# Patient Record
Sex: Female | Born: 1980 | Race: White | Hispanic: No | Marital: Married | Smoking: Current every day smoker
Health system: Southern US, Community
[De-identification: ages and names within clinical notes are randomized; demographics above are authoritative.]

## PROBLEM LIST (undated history)

## (undated) DIAGNOSIS — Z8701 Personal history of pneumonia (recurrent): Secondary | ICD-10-CM

## (undated) DIAGNOSIS — K5790 Diverticulosis of intestine, part unspecified, without perforation or abscess without bleeding: Secondary | ICD-10-CM

## (undated) DIAGNOSIS — K219 Gastro-esophageal reflux disease without esophagitis: Secondary | ICD-10-CM

## (undated) DIAGNOSIS — L853 Xerosis cutis: Secondary | ICD-10-CM

## (undated) DIAGNOSIS — L720 Epidermal cyst: Secondary | ICD-10-CM

## (undated) DIAGNOSIS — G43909 Migraine, unspecified, not intractable, without status migrainosus: Secondary | ICD-10-CM

## (undated) DIAGNOSIS — Z862 Personal history of diseases of the blood and blood-forming organs and certain disorders involving the immune mechanism: Secondary | ICD-10-CM

## (undated) DIAGNOSIS — Z8742 Personal history of other diseases of the female genital tract: Secondary | ICD-10-CM

## (undated) DIAGNOSIS — M19072 Primary osteoarthritis, left ankle and foot: Secondary | ICD-10-CM

## (undated) DIAGNOSIS — Z87898 Personal history of other specified conditions: Secondary | ICD-10-CM

## (undated) HISTORY — PX: SHOULDER SURGERY: SHX246

## (undated) HISTORY — PX: TONSILLECTOMY AND ADENOIDECTOMY: SHX28

## (undated) HISTORY — DX: Personal history of other specified conditions: Z87.898

## (undated) HISTORY — DX: Gastro-esophageal reflux disease without esophagitis: K21.9

## (undated) HISTORY — PX: VAGINAL HYSTERECTOMY: SHX2639

## (undated) HISTORY — DX: Personal history of other diseases of the female genital tract: Z87.42

## (undated) HISTORY — PX: BACK SURGERY: SHX140

## (undated) HISTORY — DX: Primary osteoarthritis, left ankle and foot: M19.072

## (undated) HISTORY — DX: Personal history of pneumonia (recurrent): Z87.01

## (undated) HISTORY — DX: Personal history of diseases of the blood and blood-forming organs and certain disorders involving the immune mechanism: Z86.2

## (undated) HISTORY — PX: ABDOMINAL HYSTERECTOMY: SHX81

## (undated) HISTORY — DX: Migraine, unspecified, not intractable, without status migrainosus: G43.909

## (undated) HISTORY — PX: TUBAL LIGATION: SHX77

## (undated) HISTORY — DX: Epidermal cyst: L72.0

## (undated) HISTORY — DX: Xerosis cutis: L85.3

## (undated) HISTORY — PX: SPINE SURGERY: SHX786

## (undated) HISTORY — DX: Diverticulosis of intestine, part unspecified, without perforation or abscess without bleeding: K57.90

## (undated) HISTORY — PX: ANKLE GANGLION CYST EXCISION: SHX1148

## (undated) HISTORY — PX: NASAL SINUS SURGERY: SHX719

## (undated) HISTORY — PX: BREAST LUMPECTOMY: SHX2

---

## 2008-04-03 HISTORY — PX: OTHER SURGICAL HISTORY: SHX169

## 2012-04-03 DIAGNOSIS — Z86010 Personal history of colonic polyps: Secondary | ICD-10-CM

## 2012-04-03 DIAGNOSIS — Z860101 Personal history of adenomatous and serrated colon polyps: Secondary | ICD-10-CM

## 2012-04-03 HISTORY — DX: Personal history of adenomatous and serrated colon polyps: Z86.0101

## 2012-04-03 HISTORY — DX: Personal history of colonic polyps: Z86.010

## 2016-05-19 DIAGNOSIS — J323 Chronic sphenoidal sinusitis: Secondary | ICD-10-CM | POA: Insufficient documentation

## 2017-04-03 HISTORY — PX: FOOT OSTEOTOMY W/ PLANTAR FASCIA RELEASE: SHX1665

## 2017-11-29 HISTORY — PX: COLONOSCOPY: SHX174

## 2017-11-29 LAB — HM COLONOSCOPY

## 2018-10-03 LAB — LIPID PANEL
Cholesterol: 165 (ref 0–200)
HDL: 51 (ref 35–70)
LDL Cholesterol: 84
LDl/HDL Ratio: 1.5
Triglycerides: 151 (ref 40–160)

## 2018-10-03 LAB — HEPATIC FUNCTION PANEL
ALT: 12 (ref 7–35)
AST: 16 (ref 13–35)
Alkaline Phosphatase: 81 (ref 25–125)
Bilirubin, Total: 0.4

## 2018-10-03 LAB — CBC AND DIFFERENTIAL
HCT: 41 (ref 36–46)
Hemoglobin: 13.6 (ref 12.0–16.0)
Neutrophils Absolute: 4.3
Platelets: 184 (ref 150–399)
WBC: 7

## 2018-10-03 LAB — BASIC METABOLIC PANEL
BUN: 7 (ref 4–21)
CO2: 20 (ref 13–22)
Chloride: 105 (ref 99–108)
Creatinine: 0.9 (ref 0.5–1.1)
Glucose: 95
Potassium: 4 (ref 3.4–5.3)
Sodium: 141 (ref 137–147)

## 2018-10-03 LAB — COMPREHENSIVE METABOLIC PANEL
Albumin: 4.3 (ref 3.5–5.0)
Calcium: 8.8 (ref 8.7–10.7)
Globulin: 1.8

## 2018-10-03 LAB — CBC: RBC: 4.45 (ref 3.87–5.11)

## 2019-10-17 LAB — HEPATIC FUNCTION PANEL
ALT: 10 (ref 7–35)
AST: 13 (ref 13–35)
Alkaline Phosphatase: 73 (ref 25–125)
Bilirubin, Total: 0.6

## 2019-10-17 LAB — LIPID PANEL
Cholesterol: 236 — AB (ref 0–200)
HDL: 59 (ref 35–70)
LDL Cholesterol: 146
Triglycerides: 176 — AB (ref 40–160)

## 2019-10-17 LAB — CBC AND DIFFERENTIAL
HCT: 43 (ref 36–46)
Hemoglobin: 13.5 (ref 12.0–16.0)
Neutrophils Absolute: 3.4
Platelets: 208 (ref 150–399)
WBC: 5.9

## 2019-10-17 LAB — CBC: RBC: 4.67 (ref 3.87–5.11)

## 2019-10-17 LAB — COMPREHENSIVE METABOLIC PANEL WITH GFR
Albumin: 4.4 (ref 3.5–5.0)
Calcium: 9.2 (ref 8.7–10.7)
GFR calc Af Amer: 101
GFR calc non Af Amer: 87
Globulin: 2.3

## 2019-10-17 LAB — BASIC METABOLIC PANEL WITH GFR
BUN: 6 (ref 4–21)
CO2: 25 — AB (ref 13–22)
Chloride: 102 (ref 99–108)
Creatinine: 0.8 (ref 0.5–1.1)
Glucose: 91
Potassium: 4.1 (ref 3.4–5.3)
Sodium: 130 — AB (ref 137–147)

## 2021-02-07 ENCOUNTER — Encounter: Payer: Self-pay | Admitting: General Practice

## 2021-02-15 ENCOUNTER — Encounter: Payer: Self-pay | Admitting: Family Medicine

## 2021-02-28 ENCOUNTER — Encounter: Payer: Self-pay | Admitting: Family Medicine

## 2021-02-28 ENCOUNTER — Other Ambulatory Visit: Payer: Self-pay

## 2021-02-28 ENCOUNTER — Ambulatory Visit (INDEPENDENT_AMBULATORY_CARE_PROVIDER_SITE_OTHER): Payer: Managed Care, Other (non HMO) | Admitting: Family Medicine

## 2021-02-28 VITALS — BP 113/75 | HR 72 | Temp 98.2°F | Ht 68.0 in | Wt 147.4 lb

## 2021-02-28 DIAGNOSIS — Z23 Encounter for immunization: Secondary | ICD-10-CM | POA: Diagnosis not present

## 2021-02-28 DIAGNOSIS — Z131 Encounter for screening for diabetes mellitus: Secondary | ICD-10-CM

## 2021-02-28 DIAGNOSIS — Z0000 Encounter for general adult medical examination without abnormal findings: Secondary | ICD-10-CM

## 2021-02-28 DIAGNOSIS — Z124 Encounter for screening for malignant neoplasm of cervix: Secondary | ICD-10-CM

## 2021-02-28 DIAGNOSIS — Z13220 Encounter for screening for lipoid disorders: Secondary | ICD-10-CM

## 2021-02-28 DIAGNOSIS — Z1231 Encounter for screening mammogram for malignant neoplasm of breast: Secondary | ICD-10-CM

## 2021-02-28 DIAGNOSIS — Z1329 Encounter for screening for other suspected endocrine disorder: Secondary | ICD-10-CM

## 2021-02-28 DIAGNOSIS — Z862 Personal history of diseases of the blood and blood-forming organs and certain disorders involving the immune mechanism: Secondary | ICD-10-CM

## 2021-02-28 DIAGNOSIS — G43909 Migraine, unspecified, not intractable, without status migrainosus: Secondary | ICD-10-CM

## 2021-02-28 DIAGNOSIS — L853 Xerosis cutis: Secondary | ICD-10-CM | POA: Diagnosis not present

## 2021-02-28 NOTE — Progress Notes (Signed)
Office Note 02/28/2021  CC:  Chief Complaint  Patient presents with   Establish Care    NP. Est care/ requesting referrals for gyn, neurologist, and dermatologist.   Gastroesophageal Reflux   HPI:  Haley Calderon is a 40 y.o. female who is here to establish care, annual health maintenance exam. Patient's most recent primary MD: Patsy Lager, MD-->[facility] in [place], [place]. Old records were reviewed prior to or during today's visit.  Haley Calderon relocated to the area from [facility].  She only lived there short while, having come from [place].  She has been following her husband's job has been moving him around.  Patient currently feels well.  He feels like the over-the-counter Prevacid 15 mg a day as needed is helpful. Her headaches have been well controlled on Ajovy for prevention and either maxalt or ubrelvy for abortive treatment. She requests neurology referral today for ongoing migraine HA management.  She describes a history of endometriosis she is status post hysterectomy.    Most recent pap smear and mammogram 07/2019 per pt report.  Past Medical History:  Diagnosis Date   Diverticulosis    on colonoscopy 2019   GERD (gastroesophageal reflux disease)    History of adenomatous polyp of colon 2014   History of iron deficiency anemia    History of mastitis    History of pneumonia    Migraine syndrome     Past Surgical History:  Procedure Laterality Date   ANKLE GANGLION CYST EXCISION Bilateral    BACK SURGERY     L5   BREAST LUMPECTOMY     CESAREAN SECTION     X2   COLONOSCOPY  11/29/2017   2014->adenomatous polyp.  11/29/17->DIVERTICULOSIS; INTERNAL HEMORRHOIDS. Recall 11/2022 ([facility], Dr. Arthor Captain. Guerry Bruin.   NASAL SINUS SURGERY     R HEEL TUMOR     BENIGN   SHOULDER SURGERY Left    TONSILLECTOMY AND ADENOIDECTOMY     VAGINAL HYSTERECTOMY      History reviewed. No pertinent  family history.  Social History   Socioeconomic History   Marital status: Not on file    Spouse name: Not on file   Number of children: Not on file   Years of education: Not on file   Highest education level: Not on file  Occupational History   Not on file  Tobacco Use   Smoking status: Every Day    Packs/day: 1.00    Types: Cigarettes   Smokeless tobacco: Never  Substance and Sexual Activity   Alcohol use: Yes    Comment: socially   Drug use: Not on file   Sexual activity: Not on file  Other Topics Concern   Not on file  Social History Narrative   Not on file   Social Determinants of Health   Financial Resource Strain: Not on file  Food Insecurity: Not on file  Transportation Needs: Not on file  Physical Activity: Not on file  Stress: Not on file  Social Connections: Not on file  Intimate Partner Violence: Not on file    MEDS:  Ajovy 225mg  [place] q month. Lansoprazole 15mg  qd Rizatriptan 10mg  qd prn Tizanidine 4mg  qd prn. Roselyn Meier prn  Allergies  Allergen Reactions   Hydromorphone Nausea And Vomiting and Shortness Of Breath    Other reaction(s): Dizziness Projectile vomiting Projectile vomiting     ROS Review of Systems  Constitutional:  Negative for appetite change, chills, fatigue and fever.  HENT:  Negative for congestion, dental problem, ear pain and sore throat.   Eyes:  Negative for discharge, redness and visual disturbance.  Respiratory:  Negative for cough, chest tightness, shortness of breath and wheezing.   Cardiovascular:  Negative for chest pain, palpitations and leg swelling.  Gastrointestinal:  Negative for abdominal pain, blood in stool, diarrhea, nausea and vomiting.  Genitourinary:  Negative for difficulty urinating, dysuria, flank pain, frequency, hematuria and urgency.  Musculoskeletal:  Negative for arthralgias, back pain, joint swelling, myalgias and neck stiffness.  Skin:  Negative for pallor and rash.  Neurological:  Negative for  dizziness, speech difficulty, weakness and headaches.  Hematological:  Negative for adenopathy. Does not bruise/bleed easily.  Psychiatric/Behavioral:  Negative for confusion and sleep disturbance. The patient is not nervous/anxious.    PE; Blood pressure 113/75, pulse 72, temperature 98.2 F (36.8 C), temperature source Temporal, height 5\' 8"  (1.727 m), weight 147 lb 6.4 oz (66.9 kg), SpO2 98 %.Body mass index is 22.41 kg/m.  EXAM chaperoned by female CMA  Gen: Alert, well appearing.  Patient is oriented to person, place, time, and situation. AFFECT: pleasant, lucid thought and speech. ENT: Ears: EACs clear, normal epithelium.  TMs with good light reflex and landmarks bilaterally.  Eyes: no injection, icteris, swelling, or exudate.  EOMI, PERRLA. Nose: no drainage or turbinate edema/swelling.  No injection or focal lesion.  Mouth: lips without lesion/swelling.  Oral mucosa pink and moist.  Dentition intact and without obvious caries or gingival swelling.  Oropharynx without erythema, exudate, or swelling.  Neck: supple/nontender.  No LAD, mass, or TM.  Carotid pulses 2+ bilaterally, without bruits. CV: RRR, no m/r/g.   LUNGS: CTA bilat, nonlabored resps, good aeration in all lung fields. ABD: soft, NT, ND, BS normal.  No hepatospenomegaly or mass.  No bruits. EXT: no clubbing, cyanosis, or edema.  Musculoskeletal: no joint swelling, erythema, warmth, or tenderness.  ROM of all joints intact. Skin - no sores or suspicious lesions or rashes or color changes  Pertinent labs:   Lab Results  Component Value Date   WBC 5.9 10/17/2019   HGB 13.5 10/17/2019   HCT 43 10/17/2019   PLT 208 10/17/2019   Lab Results  Component Value Date   CREATININE 0.8 10/17/2019   BUN 6 10/17/2019   NA 130 (A) 10/17/2019   K 4.1 10/17/2019   CL 102 10/17/2019   CO2 25 (A) 10/17/2019   Lab Results  Component Value Date   ALT 10 10/17/2019   AST 13 10/17/2019   ALKPHOS 73 10/17/2019   Lab Results   Component Value Date   CHOL 236 (A) 10/17/2019   Lab Results  Component Value Date   HDL 59 10/17/2019   Lab Results  Component Value Date   LDLCALC 146 10/17/2019   Lab Results  Component Value Date   TRIG 176 (A) 10/17/2019   ASSESSMENT AND PLAN:   New patient, establishing care.  1.  Migraine headache syndrome.  Well-controlled on Ajovy injections and prn maxalt or ubrelvy.  Per patient request will refer to neurologist for further management.  2. Health maintenance exam: Reviewed age and gender appropriate health maintenance issues (prudent diet, regular exercise, health risks of tobacco and excessive alcohol, use of seatbelts, fire alarms in home, use of sunscreen).  Also reviewed age and gender appropriate health screening as well as vaccine recommendations. Vaccines: Tdap and flu vaccines today.   Labs: fasting health panel today (screening for diabetes and hyperlipidemia).  CBC d/t hx of  IDA. Cervical ca screening: likely does not need any further screening given hx of hysterectomy. Will refer to GYN. Breast ca screening: mammogram ordered today. Colon ca screening: average risk patient= as per latest guidelines, start screening at 20yrs of age.  Pt has hx of dry skin, rosacea, and melasma-> requests referral to dermatologist so I ordered this today.  An After Visit Summary was printed and given to the patient.  No follow-ups on file.  Signed:  Crissie Sickles, MD           02/28/2021

## 2021-03-01 LAB — LIPID PANEL
Cholesterol: 157 mg/dL (ref 0–200)
HDL: 45.1 mg/dL (ref >39.00)
LDL Cholesterol: 73 mg/dL (ref 0–99)
NonHDL: 112.17
Total CHOL/HDL Ratio: 3
Triglycerides: 198 mg/dL — ABNORMAL HIGH (ref 0.0–149.0)
VLDL: 39.6 mg/dL (ref 0.0–40.0)

## 2021-03-01 LAB — CBC WITH DIFFERENTIAL/PLATELET
Basophils Absolute: 0 10*3/uL (ref 0.0–0.1)
Basophils Relative: 0.5 % (ref 0.0–3.0)
Eosinophils Absolute: 0 10*3/uL (ref 0.0–0.7)
Eosinophils Relative: 0.4 % (ref 0.0–5.0)
HCT: 42.9 % (ref 36.0–46.0)
Hemoglobin: 14.6 g/dL (ref 12.0–15.0)
Lymphocytes Relative: 36.4 % (ref 12.0–46.0)
Lymphs Abs: 1.6 10*3/uL (ref 0.7–4.0)
MCHC: 34 g/dL (ref 30.0–36.0)
MCV: 94.6 fl (ref 78.0–100.0)
Monocytes Absolute: 0.3 10*3/uL (ref 0.1–1.0)
Monocytes Relative: 6.2 % (ref 3.0–12.0)
Neutro Abs: 2.4 10*3/uL (ref 1.4–7.7)
Neutrophils Relative %: 56.5 % (ref 43.0–77.0)
Platelets: 117 10*3/uL — ABNORMAL LOW (ref 150.0–400.0)
RBC: 4.54 Mil/uL (ref 3.87–5.11)
RDW: 13 % (ref 11.5–15.5)
WBC: 4.3 10*3/uL (ref 4.0–10.5)

## 2021-03-01 LAB — COMPREHENSIVE METABOLIC PANEL
ALT: 13 U/L (ref 0–35)
AST: 17 U/L (ref 0–37)
Albumin: 4.7 g/dL (ref 3.5–5.2)
Alkaline Phosphatase: 38 U/L — ABNORMAL LOW (ref 39–117)
BUN: 6 mg/dL (ref 6–23)
CO2: 25 mEq/L (ref 19–32)
Calcium: 9.1 mg/dL (ref 8.4–10.5)
Chloride: 105 mEq/L (ref 96–112)
Creatinine, Ser: 0.8 mg/dL (ref 0.40–1.20)
GFR: 91.47 mL/min (ref >60.00)
Glucose, Bld: 85 mg/dL (ref 70–99)
Potassium: 4.3 mEq/L (ref 3.5–5.1)
Sodium: 138 mEq/L (ref 135–145)
Total Bilirubin: 0.5 mg/dL (ref 0.2–1.2)
Total Protein: 6.9 g/dL (ref 6.0–8.3)

## 2021-03-01 LAB — TSH: TSH: 1.22 u[IU]/mL (ref 0.35–5.50)

## 2021-03-02 ENCOUNTER — Telehealth: Payer: Self-pay

## 2021-03-02 DIAGNOSIS — D696 Thrombocytopenia, unspecified: Secondary | ICD-10-CM

## 2021-03-02 NOTE — Telephone Encounter (Signed)
-----   Message from Tammi Sou, MD sent at 03/01/2021  8:34 PM EST ----- All labs normal except platelets slightly low. I have low suspicion that this is of any clinical significance. However, would like to have her come back for a repeat CBC in two weeks to ensure stability. Diagnosis thrombocytopenia.

## 2021-03-02 NOTE — Telephone Encounter (Signed)
Spoke with pt regarding results/recommendations,voiced understanding. Lab visit scheduled, lab orders in.

## 2021-03-09 ENCOUNTER — Telehealth: Payer: Self-pay | Admitting: Physician Assistant

## 2021-03-09 NOTE — Telephone Encounter (Signed)
Referral attached to appointment

## 2021-03-09 NOTE — Telephone Encounter (Signed)
Patient is calling for a referral appointment from Shawnie Dapper, M.D.  Patient is scheduled for 09/01/2021 at 11:00 with [facility], PA-C.

## 2021-03-16 ENCOUNTER — Ambulatory Visit (INDEPENDENT_AMBULATORY_CARE_PROVIDER_SITE_OTHER): Payer: Managed Care, Other (non HMO)

## 2021-03-16 ENCOUNTER — Other Ambulatory Visit: Payer: Self-pay

## 2021-03-16 DIAGNOSIS — D696 Thrombocytopenia, unspecified: Secondary | ICD-10-CM | POA: Diagnosis not present

## 2021-03-16 LAB — CBC WITH DIFFERENTIAL/PLATELET
Basophils Absolute: 0 10*3/uL (ref 0.0–0.1)
Basophils Relative: 0.2 % (ref 0.0–3.0)
Eosinophils Absolute: 0 10*3/uL (ref 0.0–0.7)
Eosinophils Relative: 0.7 % (ref 0.0–5.0)
HCT: 41.2 % (ref 36.0–46.0)
Hemoglobin: 14.1 g/dL (ref 12.0–15.0)
Lymphocytes Relative: 30.5 % (ref 12.0–46.0)
Lymphs Abs: 2.1 10*3/uL (ref 0.7–4.0)
MCHC: 34.2 g/dL (ref 30.0–36.0)
MCV: 94.4 fl (ref 78.0–100.0)
Monocytes Absolute: 0.3 10*3/uL (ref 0.1–1.0)
Monocytes Relative: 4.8 % (ref 3.0–12.0)
Neutro Abs: 4.4 10*3/uL (ref 1.4–7.7)
Neutrophils Relative %: 63.8 % (ref 43.0–77.0)
Platelets: 159 10*3/uL (ref 150.0–400.0)
RBC: 4.37 Mil/uL (ref 3.87–5.11)
RDW: 12.8 % (ref 11.5–15.5)
WBC: 6.8 10*3/uL (ref 4.0–10.5)

## 2021-03-24 ENCOUNTER — Ambulatory Visit: Payer: Managed Care, Other (non HMO) | Admitting: Psychiatry

## 2021-03-24 ENCOUNTER — Encounter: Payer: Self-pay | Admitting: *Deleted

## 2021-03-24 ENCOUNTER — Encounter: Payer: Self-pay | Admitting: Psychiatry

## 2021-03-24 ENCOUNTER — Telehealth: Payer: Self-pay | Admitting: *Deleted

## 2021-03-24 VITALS — BP 125/81 | HR 61 | Ht 69.75 in | Wt 148.0 lb

## 2021-03-24 DIAGNOSIS — G43119 Migraine with aura, intractable, without status migrainosus: Secondary | ICD-10-CM

## 2021-03-24 DIAGNOSIS — Z90710 Acquired absence of both cervix and uterus: Secondary | ICD-10-CM

## 2021-03-24 HISTORY — DX: Acquired absence of both cervix and uterus: Z90.710

## 2021-03-24 MED ORDER — AJOVY 225 MG/1.5ML ~~LOC~~ SOSY: PREFILLED_SYRINGE | SUBCUTANEOUS | 2 refills | Status: DC

## 2021-03-24 MED ORDER — RIZATRIPTAN BENZOATE 10 MG PO TABS
10.0000 mg | ORAL_TABLET | ORAL | 2 refills | Status: DC | PRN
Start: 2021-03-24 — End: 2022-03-07

## 2021-03-24 MED ORDER — TIZANIDINE HCL 6 MG PO CAPS: 6.0000 mg | ORAL_CAPSULE | Freq: Three times a day (TID) | ORAL | 2 refills | Status: DC | PRN

## 2021-03-24 NOTE — Patient Instructions (Addendum)
Continue Ajovy for migraine prevention Continue Maxalt and Ubrelvy as needed for migraine rescue Increase tizanidine to 6 mg up to three times a day as needed for muscle tension  Please visit [email or web address] to express your concerns with the appointment system

## 2021-03-24 NOTE — Telephone Encounter (Signed)
Tizanidine PA, key [id number], g43.119, authentification failed. Re-entered PA information on CMM, still would not go through.  [facility], was on hold > 20 minutes, ended the call. I sent her my chart explaining and advised she can try [facility]. I will try to get PA next week. Made her aware of office holiday hours.
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
## 2021-03-31 ENCOUNTER — Other Ambulatory Visit: Payer: Self-pay | Admitting: Psychiatry

## 2021-04-03 DIAGNOSIS — I7300 Raynaud's syndrome without gangrene: Secondary | ICD-10-CM

## 2021-04-03 HISTORY — DX: Raynaud's syndrome without gangrene: I73.00

## 2021-04-08 LAB — HM PAP SMEAR
HM Pap smear: NEGATIVE
Pap: NEGATIVE

## 2021-04-08 LAB — RESULTS CONSOLE HPV: CHL HPV: NEGATIVE

## 2021-04-11 ENCOUNTER — Ambulatory Visit: Payer: Managed Care, Other (non HMO)

## 2021-04-12 ENCOUNTER — Ambulatory Visit
Admission: RE | Admit: 2021-04-12 | Discharge: 2021-04-12 | Disposition: A | Discharge status: Home / Self Care | Payer: Managed Care, Other (non HMO) | Source: Ambulatory Visit | Attending: Family Medicine | Admitting: Family Medicine

## 2021-04-12 DIAGNOSIS — Z1231 Encounter for screening mammogram for malignant neoplasm of breast: Secondary | ICD-10-CM

## 2021-05-11 ENCOUNTER — Telehealth: Payer: Self-pay | Admitting: *Deleted

## 2021-05-11 NOTE — Telephone Encounter (Signed)
Tizanidine capsule PA. Key [id number] [facility] is reviewing your PA request and will respond within 24 hours for Medicaid or up to 72 hours for non-Medicaid plans.

## 2021-05-16 ENCOUNTER — Other Ambulatory Visit: Payer: Self-pay | Admitting: Psychiatry

## 2021-05-16 ENCOUNTER — Encounter: Payer: Self-pay | Admitting: *Deleted

## 2021-05-16 MED ORDER — AJOVY 225 MG/1.5ML ~~LOC~~ SOSY: PREFILLED_SYRINGE | SUBCUTANEOUS | 11 refills | Status: DC

## 2021-05-16 MED ORDER — TIZANIDINE HCL 4 MG PO TABS: 6.0000 mg | ORAL_TABLET | Freq: Four times a day (QID) | ORAL | 11 refills | Status: DC | PRN

## 2021-05-16 NOTE — Telephone Encounter (Signed)
Called [facility] to check tizanidine PA, spoke with Amber who stated this is a [employer] case, their [phone], she transferred the call, was on hold 20 minutes will call back later. Sent my chart asking patient if she is getting refills ok.

## 2021-05-16 NOTE — Telephone Encounter (Signed)
I added more Ajovy refills so she should be good for the year. I changed tizanidine to tablets. The tablets only go up to 4 mg so she'll have to take 1.5 tablets to get the 6 mg dose

## 2021-06-27 ENCOUNTER — Encounter: Payer: Self-pay | Admitting: Family Medicine

## 2021-06-27 ENCOUNTER — Other Ambulatory Visit: Payer: Self-pay

## 2021-06-27 ENCOUNTER — Telehealth: Payer: Self-pay

## 2021-06-27 ENCOUNTER — Ambulatory Visit: Payer: Managed Care, Other (non HMO) | Admitting: Family Medicine

## 2021-06-27 VITALS — BP 118/80 | HR 63 | Temp 98.0°F | Ht 69.75 in | Wt 146.6 lb

## 2021-06-27 DIAGNOSIS — Z8701 Personal history of pneumonia (recurrent): Secondary | ICD-10-CM | POA: Diagnosis not present

## 2021-06-27 DIAGNOSIS — J209 Acute bronchitis, unspecified: Secondary | ICD-10-CM

## 2021-06-27 DIAGNOSIS — J069 Acute upper respiratory infection, unspecified: Secondary | ICD-10-CM | POA: Diagnosis not present

## 2021-06-27 MED ORDER — PREDNISONE 20 MG PO TABS: ORAL_TABLET | ORAL | 0 refills | Status: DC

## 2021-06-27 MED ORDER — ALBUTEROL SULFATE HFA 108 (90 BASE) MCG/ACT IN AERS: 2.0000 | INHALATION_SPRAY | Freq: Four times a day (QID) | RESPIRATORY_TRACT | 1 refills | Status: DC | PRN

## 2021-06-27 MED ORDER — DOXYCYCLINE HYCLATE 100 MG PO CAPS: 100.0000 mg | ORAL_CAPSULE | Freq: Two times a day (BID) | ORAL | 0 refills | Status: AC

## 2021-06-27 NOTE — Telephone Encounter (Signed)
PA sent via covermymed on 06/27/21 ?  ?Key: [id number] - PA Case ID: [id number] - Rx #: [id number] ?  ?Medication: albuterol (VENTOLIN HFA) 108 (90 Base) MCG/ACT inhaler ?  ?Dx: Acute bronchitis, unspecified organism J20.9 ? ?URI with cough and congestion J06.9 ?  ?Per Dr. Anitra Lauth pt has tried and failed N/A ?  ?Waiting for response.  ? ?Preferred on formulary: ?ALBUTEROL HFA INHALER 8.5GM 90MCG ?VENTOLIN HFA INH 18GM W/COUNT 90MCG ?ALBUTEROL HFA INHALER 8.5GM 90MCG ?
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
## 2021-06-27 NOTE — Patient Instructions (Signed)
Buy over the counter mucinex dm tabs and take as directed on packaging. ?

## 2021-06-28 ENCOUNTER — Telehealth: Payer: Self-pay

## 2021-06-28 NOTE — Telephone Encounter (Signed)
PA completed yesterday for albuterol inhaler but denied.  ? ?Preferred alternative are: ? ?ALBUTEROL HFA INHALER 8.5GM 90MCG ?VENTOLIN HFA INH 18GM W/COUNT 90MCG ?ALBUTEROL HFA INHALER 8.5GM 90MCG ? ? ?Please Advise. ?

## 2021-06-29 ENCOUNTER — Ambulatory Visit: Payer: Managed Care, Other (non HMO) | Admitting: Family Medicine

## 2021-06-29 MED ORDER — ALBUTEROL SULFATE HFA 108 (90 BASE) MCG/ACT IN AERS: 2.0000 | INHALATION_SPRAY | Freq: Four times a day (QID) | RESPIRATORY_TRACT | 0 refills | Status: DC | PRN

## 2021-06-29 NOTE — Telephone Encounter (Signed)
Okay Ventolin prescription sent ?

## 2021-06-29 NOTE — Telephone Encounter (Signed)
LM for pt to return call regarding med change.  ? ?

## 2021-06-29 NOTE — Telephone Encounter (Signed)
Spoke with patient regarding results/recommendations.  

## 2021-09-01 ENCOUNTER — Encounter: Payer: Self-pay | Admitting: Physician Assistant

## 2021-09-01 ENCOUNTER — Ambulatory Visit: Payer: Managed Care, Other (non HMO) | Admitting: Physician Assistant

## 2021-09-01 DIAGNOSIS — D225 Melanocytic nevi of trunk: Secondary | ICD-10-CM

## 2021-09-01 DIAGNOSIS — L719 Rosacea, unspecified: Secondary | ICD-10-CM

## 2021-09-01 DIAGNOSIS — L309 Dermatitis, unspecified: Secondary | ICD-10-CM | POA: Diagnosis not present

## 2021-09-01 DIAGNOSIS — L700 Acne vulgaris: Secondary | ICD-10-CM

## 2021-09-01 DIAGNOSIS — D229 Melanocytic nevi, unspecified: Secondary | ICD-10-CM

## 2021-09-01 DIAGNOSIS — D485 Neoplasm of uncertain behavior of skin: Secondary | ICD-10-CM

## 2021-09-01 HISTORY — DX: Melanocytic nevi, unspecified: D22.9

## 2021-09-01 MED ORDER — RHOFADE 1 % EX CREA: TOPICAL_CREAM | CUTANEOUS | 6 refills | Status: DC

## 2021-09-01 MED ORDER — TETRIX EX CREA: TOPICAL_CREAM | CUTANEOUS | Status: DC

## 2021-09-01 MED ORDER — MINOCYCLINE HCL 100 MG PO CAPS: 100.0000 mg | ORAL_CAPSULE | Freq: Two times a day (BID) | ORAL | 6 refills | Status: DC

## 2021-09-01 MED ORDER — TRETINOIN 0.025 % EX CREA: TOPICAL_CREAM | Freq: Every day | CUTANEOUS | 6 refills | Status: AC

## 2021-09-01 MED ORDER — TETRIX EX CREA: TOPICAL_CREAM | CUTANEOUS | 9 refills | Status: DC

## 2021-09-01 NOTE — Progress Notes (Signed)
   New Patient   Subjective  Haley Calderon is a 41 y.o. female who presents for the following: New Patient (Initial Visit) (Wants refills for rosacea meds rhofade and . Also wants refill on tetrix cream.(For dry skin). /) and Annual Exam (Wants skin exam family history skin cancer. No history of skin cancer. ).   The following portions of the chart were reviewed this encounter and updated as appropriate:  Tobacco  Allergies  Meds  Problems  Med Hx  Surg Hx  Fam Hx      Objective  Well appearing patient in no apparent distress; mood and affect are within normal limits.  A full examination was performed including scalp, head, eyes, ears, nose, lips, neck, chest, axillae, abdomen, back, buttocks, bilateral upper extremities, bilateral lower extremities, hands, feet, fingers, toes, fingernails, and toenails. All findings within normal limits unless otherwise noted below.  Head - Anterior (Face) Numerous telangectasia  Head - Anterior (Face) Open comeodomes  Mid Back Bichromic dark nested macule.            Left Dorsal Hand, Right Dorsal Hand Ill-defined pink papules/plaques with scale-crust.    Assessment & Plan  Rosacea Head - Anterior (Face)  minocycline (MINOCIN) 100 MG capsule - Head - Anterior (Face) Take 1 capsule (100 mg total) by mouth 2 (two) times daily.  Oxymetazoline HCl (RHOFADE) 1 % CREA - Head - Anterior (Face) Apply to affected area qd  Acne vulgaris Head - Anterior (Face)  tretinoin (RETIN-A) 0.025 % cream - Head - Anterior (Face) Apply topically at bedtime.  Neoplasm of uncertain behavior of skin Mid Back  Skin / nail biopsy Type of biopsy: tangential   Informed consent: discussed and consent obtained   Timeout: patient name, date of birth, surgical site, and procedure verified   Procedure prep:  Patient was prepped and draped in usual sterile fashion (Non sterile) Prep type:  Chlorhexidine Anesthesia: the lesion was  anesthetized in a standard fashion   Anesthetic:  1% lidocaine w/ epinephrine 1-100,000 local infiltration Instrument used: flexible razor blade   Outcome: patient tolerated procedure well   Post-procedure details: wound care instructions given    Specimen 1 - Surgical pathology Differential Diagnosis: r/o atypia  Check Margins: yes  Eczema, unspecified type Left Dorsal Hand; Right Dorsal Hand  Dermatological Products, Misc. (TETRIX) CREA - Left Dorsal Hand, Right Dorsal Hand Apply to affected area qd     I, Marshaun Lortie, PA-C, have reviewed all documentation's for this visit.  The documentation on 09/01/21 for the exam, diagnosis, procedures and orders are all accurate and complete.

## 2021-09-26 ENCOUNTER — Encounter: Payer: Self-pay | Admitting: Physician Assistant

## 2021-09-26 ENCOUNTER — Telehealth: Payer: Self-pay | Admitting: Physician Assistant

## 2021-09-30 ENCOUNTER — Telehealth (INDEPENDENT_AMBULATORY_CARE_PROVIDER_SITE_OTHER): Payer: Managed Care, Other (non HMO) | Admitting: Dermatology

## 2021-09-30 NOTE — Telephone Encounter (Signed)
I returned patient's call.  She had contacted the office about discussing the results of her skin biopsy but had not received a call back.  I told her that the mole showed mild atypia with clean margins and no further procedure is required.  She also informed me that the pharmacy told her the Rhofade prescription would be $600 so this was not obtained.  Additionally she was told that the antibiotic was not available.  I suggested she call the office back to see if what she was told is reality or whether a substitute could be offered.

## 2021-10-10 NOTE — Telephone Encounter (Signed)
Phone call to patient with her pathology results. Patient aware.

## 2021-11-08 ENCOUNTER — Encounter: Payer: Self-pay | Admitting: Family Medicine

## 2021-12-01 ENCOUNTER — Other Ambulatory Visit: Payer: Self-pay | Admitting: Obstetrics and Gynecology

## 2021-12-01 DIAGNOSIS — N6341 Unspecified lump in right breast, subareolar: Secondary | ICD-10-CM

## 2021-12-06 ENCOUNTER — Ambulatory Visit
Admission: RE | Admit: 2021-12-06 | Discharge: 2021-12-06 | Disposition: A | Discharge status: Home / Self Care | Payer: Managed Care, Other (non HMO) | Source: Ambulatory Visit | Attending: Obstetrics and Gynecology | Admitting: Obstetrics and Gynecology

## 2021-12-06 ENCOUNTER — Other Ambulatory Visit: Payer: Self-pay | Admitting: Obstetrics and Gynecology

## 2021-12-06 DIAGNOSIS — N6341 Unspecified lump in right breast, subareolar: Secondary | ICD-10-CM

## 2021-12-06 DIAGNOSIS — N611 Abscess of the breast and nipple: Secondary | ICD-10-CM

## 2021-12-07 ENCOUNTER — Other Ambulatory Visit: Payer: Self-pay | Admitting: Obstetrics and Gynecology

## 2021-12-07 ENCOUNTER — Ambulatory Visit
Admission: RE | Admit: 2021-12-07 | Discharge: 2021-12-07 | Disposition: A | Discharge status: Home / Self Care | Payer: Managed Care, Other (non HMO) | Source: Ambulatory Visit | Attending: Obstetrics and Gynecology | Admitting: Obstetrics and Gynecology

## 2021-12-07 DIAGNOSIS — N611 Abscess of the breast and nipple: Secondary | ICD-10-CM

## 2021-12-14 ENCOUNTER — Other Ambulatory Visit: Payer: Managed Care, Other (non HMO)

## 2021-12-19 ENCOUNTER — Other Ambulatory Visit (HOSPITAL_COMMUNITY): Payer: Self-pay

## 2021-12-19 ENCOUNTER — Other Ambulatory Visit: Payer: Self-pay | Admitting: General Surgery

## 2021-12-19 MED ORDER — SULFAMETHOXAZOLE-TRIMETHOPRIM 800-160 MG PO TABS
1.0000 | ORAL_TABLET | Freq: Two times a day (BID) | ORAL | 1 refills | Status: DC
  Filled 2021-12-19: qty 14, 7d supply, fill #0

## 2021-12-20 ENCOUNTER — Other Ambulatory Visit (HOSPITAL_COMMUNITY): Payer: Self-pay

## 2021-12-21 ENCOUNTER — Other Ambulatory Visit: Payer: Managed Care, Other (non HMO)

## 2021-12-29 NOTE — Progress Notes (Signed)
Surgical Instructions    Your procedure is scheduled on Tuesday October 3rd.  Report to [facility] Main Entrance "A" at 7 A.M., then check in with the Admitting office.  Call this number if you have problems the morning of surgery:  [phone]   If you have any questions prior to your surgery date call [phone]: Open Monday-Friday 8am-4pm If you experience any cold or flu symptoms such as cough, fever, chills, shortness of breath, etc. between now and your scheduled surgery, please notify us at the above number     Remember:  Do not eat after midnight the night before your surgery  You may drink clear liquids until 6 the morning of your surgery.   Clear liquids allowed are: Water, Non-Citrus Juices (without pulp), Carbonated Beverages, Clear Tea, Black Coffee ONLY (NO MILK, CREAM OR POWDERED CREAMER of any kind), and Gatorade    Take these medicines the morning of surgery with A SIP OF WATER: sulfamethoxazole-trimethoprim (BACTRIM DS) 800-160 MG tablet   IF NEEDED  lansoprazole (PREVACID) 15 MG capsule rizatriptan (MAXALT) 10 MG tablet tiZANidine (ZANAFLEX) 4 MG tablet     As of today, STOP taking any Aspirin (unless otherwise instructed by your surgeon) Aleve, Naproxen, Ibuprofen, Motrin, Advil, Goody's, BC's, all herbal medications, fish oil, and all vitamins.           Do not wear jewelry or makeup. Do not wear lotions, powders, perfumes or deodorant. Do not shave 48 hours prior to surgery.   Do not bring valuables to the hospital. Do not wear nail polish, gel polish, artificial nails, or any other type of covering on natural nails (fingers and toes) If you have artificial nails or gel coating that need to be removed by a nail salon, please have this removed prior to surgery. Artificial nails or gel coating may interfere with anesthesia's ability to adequately monitor your vital signs.  [facility] is not responsible for any belongings or valuables.    Do NOT Smoke  (Tobacco/Vaping)  24 hours prior to your procedure  If you use a CPAP at night, you may bring your mask for your overnight stay.   Contacts, glasses, hearing aids, dentures or partials may not be worn into surgery, please bring cases for these belongings   For patients admitted to the hospital, discharge time will be determined by your treatment team.   Patients discharged the day of surgery will not be allowed to drive home, and someone needs to stay with them for 24 hours.   SURGICAL WAITING ROOM VISITATION Patients having surgery or a procedure may have no more than 2 support people in the waiting area - these visitors may rotate.   Children under the age of 52 must have an adult with them who is not the patient. If the patient needs to stay at the hospital during part of their recovery, the visitor guidelines for inpatient rooms apply. Pre-op nurse will coordinate an appropriate time for 1 support person to accompany patient in pre-op.  This support person may not rotate.   Please refer to the [facility] website for the visitor guidelines for Inpatients (after your surgery is over and you are in a regular room).    Special instructions:    Oral Hygiene is also important to reduce your risk of infection.  Remember - BRUSH YOUR TEETH THE MORNING OF SURGERY WITH YOUR REGULAR TOOTHPASTE   [facility]- Preparing For Surgery  Before surgery, you can play an important role. Because skin  is not sterile, your skin needs to be as free of germs as possible. You can reduce the number of germs on your skin by washing with CHG (chlorahexidine gluconate) Soap before surgery.  CHG is an antiseptic cleaner which kills germs and bonds with the skin to continue killing germs even after washing.     Please do not use if you have an allergy to CHG or antibacterial soaps. If your skin becomes reddened/irritated stop using the CHG.  Do not shave (including legs and underarms) for at least 48 hours prior  to first CHG shower. It is OK to shave your face.  Please follow these instructions carefully.     Shower the NIGHT BEFORE SURGERY and the MORNING OF SURGERY with CHG Soap.   If you chose to wash your hair, wash your hair first as usual with your normal shampoo. After you shampoo, rinse your hair and body thoroughly to remove the shampoo.  Then [employer] and genitals (private parts) with your normal soap and rinse thoroughly to remove soap.  After that Use CHG Soap as you would any other liquid soap. You can apply CHG directly to the skin and wash gently with a scrungie or a clean washcloth.   Apply the CHG Soap to your body ONLY FROM THE NECK DOWN.  Do not use on open wounds or open sores. Avoid contact with your eyes, ears, mouth and genitals (private parts). Wash Face and genitals (private parts)  with your normal soap.   Wash thoroughly, paying special attention to the area where your surgery will be performed.  Thoroughly rinse your body with warm water from the neck down.  DO NOT shower/wash with your normal soap after using and rinsing off the CHG Soap.  Pat yourself dry with a CLEAN TOWEL.  Wear CLEAN PAJAMAS to bed the night before surgery  Place CLEAN SHEETS on your bed the night before your surgery  DO NOT SLEEP WITH PETS.   Day of Surgery:  Take a shower with CHG soap. Wear Clean/Comfortable clothing the morning of surgery Do not apply any deodorants/lotions.   Remember to brush your teeth WITH YOUR REGULAR TOOTHPASTE.    If you received a COVID test during your pre-op visit, it is requested that you wear a mask when out in public, stay away from anyone that may not be feeling well, and notify your surgeon if you develop symptoms. If you have been in contact with anyone that has tested positive in the last 10 days, please notify your surgeon.    Please read over the following fact sheets that you were given.

## 2021-12-30 ENCOUNTER — Encounter (HOSPITAL_COMMUNITY)
Admission: RE | Admit: 2021-12-30 | Discharge: 2021-12-30 | Disposition: A | Discharge status: Home / Self Care | Payer: Managed Care, Other (non HMO) | Source: Ambulatory Visit | Attending: General Surgery | Admitting: General Surgery

## 2021-12-30 ENCOUNTER — Encounter (HOSPITAL_COMMUNITY): Payer: Self-pay

## 2021-12-30 ENCOUNTER — Other Ambulatory Visit: Payer: Self-pay

## 2021-12-30 VITALS — BP 139/78 | HR 58 | Temp 98.2°F | Resp 18 | Ht 69.0 in | Wt 153.0 lb

## 2021-12-30 DIAGNOSIS — D509 Iron deficiency anemia, unspecified: Secondary | ICD-10-CM | POA: Diagnosis not present

## 2021-12-30 DIAGNOSIS — Z01812 Encounter for preprocedural laboratory examination: Secondary | ICD-10-CM | POA: Diagnosis not present

## 2021-12-30 LAB — CBC
HCT: 43 % (ref 36.0–46.0)
Hemoglobin: 15.2 g/dL — ABNORMAL HIGH (ref 12.0–15.0)
MCH: 33.1 pg (ref 26.0–34.0)
MCHC: 35.3 g/dL (ref 30.0–36.0)
MCV: 93.7 fL (ref 80.0–100.0)
Platelets: 196 10*3/uL (ref 150–400)
RBC: 4.59 MIL/uL (ref 3.87–5.11)
RDW: 12 % (ref 11.5–15.5)
WBC: 8.9 10*3/uL (ref 4.0–10.5)
nRBC: 0 % (ref 0.0–0.2)

## 2021-12-30 NOTE — Progress Notes (Signed)
PCP - Shawnie Dapper, MD Cardiologist - Denies  PPM/ICD - Denies Device Orders -  Rep Notified -   Chest x-ray - NI EKG - NI Stress Test - Denies ECHO - Denies Cardiac Cath - Denies  Sleep Study - Denies CPAP -   DM - Denies Fasting Blood Sugar -  Checks Blood Sugar _____ times a day  Blood Thinner Instructions: Denies Aspirin Instructions:Denies  ERAS Protcol -Yes   COVID TEST- NI   Anesthesia review: NO  Patient denies shortness of breath, fever, cough and chest pain at PAT appointment   All instructions explained to the patient, with a verbal understanding of the material. Patient agrees to go over the instructions while at home for a better understanding. The opportunity to ask questions was provided.

## 2022-01-02 NOTE — H&P (Signed)
REFERRING PHYSICIAN:  Royston Sinner   PROVIDER:  Georgianne Fick, MD   Care Team: Patient Care Team: Tammi Sou, MD as PCP - General (Family Medicine) Lucillie Garfinkel (Gynecology) Suzanne Boron, MD (Dermatology)    MRN: [id number] DOB: Jul 06, 1980 DATE OF ENCOUNTER: 12/19/2021   Subjective    Chief Complaint: Right Breast Abscess       History of Present Illness: Haley Calderon is a 41 y.o. female who is seen today as an office consultation at the request of Dr. Royston Sinner for evaluation of Right Breast Abscess .     Pt presented to GYN with a right breast abscess.  She was sent to radiology for mammo/us.  The abscess appeared to be in the skin rather than the breast parenchyma so it was not aspirated.   The patient had this problem started around 2 weeks ago.  It at first was around the size of a pea.  Over the weekend it became much larger to around the size of a lime.  She underwent ultrasound and mammogram to make sure it was not too long.  It appeared to be a cyst and she went for ultrasound-guided aspiration the next day.  Was felt to be intradermal rather than subcutaneous so it was not aspirated.  She did warm compresses and around 3 days later it opened up and drained a copious amount of pus.  Few days later it opened up again and she said some of the cyst wall appeared to come out along with some other debris.  Her breast was exquisitely tender during this time.  The redness went laterally all the way up under her armpit when it was at its worst.  At that point, she was having burning pain.     Family cancer history: Her maternal grandmother had breast and colon cancer.     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  Her 41-year-old son gets off the bus at 2:45 and she has to be home for him.       Diagnostic mammogram/US 12/07/2021 [facility] ACR Breast Density Category c: The breast tissue is heterogeneously dense, which may obscure small masses.    FINDINGS: There is skin and trabecular thickening involving the anterior portion of the RIGHT breast. This region is marked as palpable with BB.   On physical exam, there is erythema and skin thickening in the 8 o'clock retroareolar region of the RIGHT breast. Patient is tender during physical exam of this region. Patient denies any drainage.   Targeted ultrasound is performed, showing a circumscribed oval collection in the 8 o'clock location of the RIGHT breast 2 centimeters from the nipple. Collection is interposed between dermis and subdermal parenchyma and measures 2.2 x 1.0 x 2.0 centimeters. There is surrounding hyperemia. No internal blood flow. Underlying parenchyma is normal.   Evaluation of the RIGHT axilla is negative.   IMPRESSION: Superficial abscess in the 8 o'clock location of the RIGHT breast.   RECOMMENDATION: 1. Recommend antibiotic treatment. Patient is given a 7 day course of doxycycline. 2. Recommend ultrasound-guided abscess drainage. Procedure has been scheduled for 12/07/2021.   I have discussed the findings and recommendations with the patient. If applicable, a reminder letter will be sent to the patient regarding the next appointment.   BI-RADS CATEGORY  2: Benign.       Review of Systems: A complete review of systems was obtained from the patient.  I have reviewed this information  and discussed as appropriate with the patient.  See HPI as well for other ROS.   Medical History: Past Medical History  History reviewed. No pertinent past medical history.        Patient Active Problem List  Diagnosis   Abscess of right breast      Past Surgical History       Past Surgical History:  Procedure Laterality Date   FUSION-NASAL/SINUS ENDOSCOPY, TISSUE REMOVAL MAXILLARY SINUS   08/29/2016    Dr. Ashley Royalty - [facility]   PERONEUS BREVIS REPAIR/TENOLYSIS FLEXOR/EXTENSOR TENDON LEG   RIGHT ANKLE   03/19/2018    Dr. Jeannine Kitten - [facility]   TOTAL  LAP HYSTERECTOMY W/BS / CYSTOSCOPY   09/16/2019    Dr. Alveta Heimlich - [facility]   PLANTAR FASCIA RELEASE   01/16/2020    Dr. Jeannine Kitten - [facility]   Back Surgery       CESAREAN SECTION       REPEAT CESAREAN SECTION            Allergies       Allergies  Allergen Reactions   Hydromorphone Dizziness, Nausea And Vomiting, Shortness Of Breath and Vomiting      Other reaction(s): Dizziness  Projectile vomiting  Projectile vomiting   Projectile vomiting        No current outpatient medications on file prior to visit.    No current facility-administered medications on file prior to visit.      Family History       Family History  Problem Relation Age of Onset   Colon cancer Mother     Breast cancer Mother     High blood pressure (Hypertension) Mother     Hyperlipidemia (Elevated cholesterol) Mother     Hyperlipidemia (Elevated cholesterol) Maternal Grandmother     High blood pressure (Hypertension) Maternal Grandmother     Breast cancer Maternal Grandmother     High blood pressure (Hypertension) Maternal Grandfather     Hyperlipidemia (Elevated cholesterol) Maternal Grandfather     Stroke Maternal Grandfather          Social History        Tobacco Use  Smoking Status Every Day   Packs/day: 1.00   Types: Cigarettes  Smokeless Tobacco Never      Social History  Social History         Socioeconomic History   Marital status: Married  Tobacco Use   Smoking status: Every Day      Packs/day: 1.00      Types: Cigarettes   Smokeless tobacco: Never  Substance and Sexual Activity   Alcohol use: Yes      Alcohol/week: 2.0 standard drinks      Types: 2 Standard drinks or equivalent per week   Drug use: Never        Objective:         Vitals:    12/19/21 1352  BP: 108/74  Pulse: 76  Temp: 36.8 C (98.3 F)  SpO2: 97%  Weight: 68.8 kg (151 lb 9.6 oz)  Height: 176.5 cm (5' 9.5")    Body mass index is 22.07 kg/m.     Gen:  No acute  distress.  Well nourished and well groomed.   Neurological: Alert and oriented to person, place, and time. Coordination normal.  Head: Normocephalic and atraumatic.  Eyes: Conjunctivae are normal. Pupils are equal, round, and reactive to light. No scleral icterus.  Neck: Normal range of motion. Neck supple.  Cardiovascular: Normal rate Breast: Appear relatively symmetric in size.  The right breast has a firm lesion around 1 x 2 cm at the right lateral areolar border.  There are 2 sinus tracts no longer draining.  There is still some faint redness in this location.  No axillary lymphadenopathy. Respiratory: Effort normal.  No respiratory distress. No chest wall tenderness. Breath sounds normal.  No wheezes, rales or rhonchi.   Skin: Skin is warm and dry. No rash noted. No diaphoresis. No erythema. No pallor. No clubbing, cyanosis, or edema.   Psychiatric: Normal mood and affect. Behavior is normal. Judgment and thought content normal.      Labs N/a   Assessment and Plan:        ICD-10-CM    1. Abscess of right breast  N61.1         This is likely an epidermal inclusion cyst.  The infection appears to be nearly completely resolved.  We will do 1 more week of Bactrim.  I will plan to excise this in its entirety.  We will do this in the operating room with sedation and numbing medicine.  I discussed the possible risk is wound complications including infection and recurrent cyst.  She understands and wishes to proceed.  She does not desire it to get large and swollen like last time again.       No follow-ups on file.     Milus Height, MD FACS Surgical Oncology, General Surgery, Trauma and [facility]

## 2022-01-03 ENCOUNTER — Other Ambulatory Visit: Payer: Self-pay

## 2022-01-03 ENCOUNTER — Ambulatory Visit (HOSPITAL_COMMUNITY)
Admission: RE | Admit: 2022-01-03 | Discharge: 2022-01-03 | Disposition: A | Discharge status: Home / Self Care | Payer: Managed Care, Other (non HMO) | Attending: General Surgery | Admitting: General Surgery

## 2022-01-03 ENCOUNTER — Ambulatory Visit (HOSPITAL_BASED_OUTPATIENT_CLINIC_OR_DEPARTMENT_OTHER): Payer: Managed Care, Other (non HMO) | Admitting: Anesthesiology

## 2022-01-03 ENCOUNTER — Encounter (HOSPITAL_COMMUNITY)
Admission: RE | Disposition: A | Discharge status: Home / Self Care | Payer: Self-pay | Source: Home / Self Care | Attending: General Surgery

## 2022-01-03 ENCOUNTER — Ambulatory Visit (HOSPITAL_COMMUNITY): Payer: Managed Care, Other (non HMO) | Admitting: Anesthesiology

## 2022-01-03 ENCOUNTER — Encounter (HOSPITAL_COMMUNITY): Payer: Self-pay | Admitting: General Surgery

## 2022-01-03 ENCOUNTER — Other Ambulatory Visit (HOSPITAL_COMMUNITY): Payer: Self-pay

## 2022-01-03 DIAGNOSIS — K219 Gastro-esophageal reflux disease without esophagitis: Secondary | ICD-10-CM | POA: Insufficient documentation

## 2022-01-03 DIAGNOSIS — N6031 Fibrosclerosis of right breast: Secondary | ICD-10-CM | POA: Diagnosis not present

## 2022-01-03 DIAGNOSIS — I7300 Raynaud's syndrome without gangrene: Secondary | ICD-10-CM | POA: Insufficient documentation

## 2022-01-03 DIAGNOSIS — L720 Epidermal cyst: Secondary | ICD-10-CM | POA: Insufficient documentation

## 2022-01-03 DIAGNOSIS — N6310 Unspecified lump in the right breast, unspecified quadrant: Secondary | ICD-10-CM

## 2022-01-03 HISTORY — PX: MASS EXCISION: SHX2000

## 2022-01-03 SURGERY — EXCISION MASS
Anesthesia: General | Site: Breast | Laterality: Right

## 2022-01-03 MED ORDER — CHLORHEXIDINE GLUCONATE CLOTH 2 % EX PADS: 6.0000 | MEDICATED_PAD | Freq: Once | CUTANEOUS | Status: DC

## 2022-01-03 MED ORDER — LIDOCAINE 2% (20 MG/ML) 5 ML SYRINGE
INTRAMUSCULAR | Status: AC
  Filled 2022-01-03: qty 5

## 2022-01-03 MED ORDER — ORAL CARE MOUTH RINSE: 15.0000 mL | Freq: Once | OROMUCOSAL | Status: AC

## 2022-01-03 MED ORDER — ONDANSETRON HCL 4 MG/2ML IJ SOLN
INTRAMUSCULAR | Status: AC
  Filled 2022-01-03: qty 2

## 2022-01-03 MED ORDER — FENTANYL CITRATE (PF) 100 MCG/2ML IJ SOLN
INTRAMUSCULAR | Status: DC | PRN
  Administered 2022-01-03 (×6): 25 ug via INTRAVENOUS

## 2022-01-03 MED ORDER — LIDOCAINE HCL (PF) 1 % IJ SOLN
INTRAMUSCULAR | Status: AC
  Filled 2022-01-03: qty 30

## 2022-01-03 MED ORDER — LIDOCAINE 2% (20 MG/ML) 5 ML SYRINGE
INTRAMUSCULAR | Status: DC | PRN
  Administered 2022-01-03: 60 mg via INTRAVENOUS

## 2022-01-03 MED ORDER — DEXAMETHASONE SODIUM PHOSPHATE 10 MG/ML IJ SOLN
INTRAMUSCULAR | Status: AC
  Filled 2022-01-03: qty 1

## 2022-01-03 MED ORDER — ACETAMINOPHEN 500 MG PO TABS
1000.0000 mg | ORAL_TABLET | ORAL | Status: AC
  Administered 2022-01-03: 1000 mg via ORAL
  Filled 2022-01-03: qty 2

## 2022-01-03 MED ORDER — DEXMEDETOMIDINE HCL IN NACL 80 MCG/20ML IV SOLN
INTRAVENOUS | Status: AC
  Filled 2022-01-03: qty 20

## 2022-01-03 MED ORDER — FENTANYL CITRATE (PF) 250 MCG/5ML IJ SOLN
INTRAMUSCULAR | Status: AC
  Filled 2022-01-03: qty 5

## 2022-01-03 MED ORDER — MIDAZOLAM HCL 2 MG/2ML IJ SOLN
INTRAMUSCULAR | Status: AC
  Filled 2022-01-03: qty 2

## 2022-01-03 MED ORDER — EPHEDRINE SULFATE-NACL 50-0.9 MG/10ML-% IV SOSY
PREFILLED_SYRINGE | INTRAVENOUS | Status: DC | PRN
  Administered 2022-01-03 (×2): 5 mg via INTRAVENOUS

## 2022-01-03 MED ORDER — CHLORHEXIDINE GLUCONATE 0.12 % MT SOLN
15.0000 mL | Freq: Once | OROMUCOSAL | Status: AC
  Administered 2022-01-03: 15 mL via OROMUCOSAL
  Filled 2022-01-03: qty 15

## 2022-01-03 MED ORDER — KETOROLAC TROMETHAMINE 30 MG/ML IJ SOLN
INTRAMUSCULAR | Status: DC | PRN
  Administered 2022-01-03: 30 mg via INTRAVENOUS

## 2022-01-03 MED ORDER — KETOROLAC TROMETHAMINE 30 MG/ML IJ SOLN
INTRAMUSCULAR | Status: AC
  Filled 2022-01-03: qty 1

## 2022-01-03 MED ORDER — LIDOCAINE HCL 1 % IJ SOLN
INTRAMUSCULAR | Status: DC | PRN
  Administered 2022-01-03: 40 mL

## 2022-01-03 MED ORDER — PROPOFOL 10 MG/ML IV BOLUS
INTRAVENOUS | Status: DC | PRN
  Administered 2022-01-03: 170 mg via INTRAVENOUS
  Administered 2022-01-03: 30 mg via INTRAVENOUS

## 2022-01-03 MED ORDER — PROPOFOL 10 MG/ML IV BOLUS
INTRAVENOUS | Status: AC
  Filled 2022-01-03: qty 20

## 2022-01-03 MED ORDER — CEFAZOLIN SODIUM-DEXTROSE 2-4 GM/100ML-% IV SOLN
2.0000 g | INTRAVENOUS | Status: AC
  Administered 2022-01-03: 2 g via INTRAVENOUS
  Filled 2022-01-03: qty 100

## 2022-01-03 MED ORDER — DEXMEDETOMIDINE HCL IN NACL 80 MCG/20ML IV SOLN
INTRAVENOUS | Status: DC | PRN
  Administered 2022-01-03 (×2): 8 ug via BUCCAL

## 2022-01-03 MED ORDER — MIDAZOLAM HCL 5 MG/5ML IJ SOLN
INTRAMUSCULAR | Status: DC | PRN
  Administered 2022-01-03: 2 mg via INTRAVENOUS

## 2022-01-03 MED ORDER — EPHEDRINE 5 MG/ML INJ
INTRAVENOUS | Status: AC
  Filled 2022-01-03: qty 5

## 2022-01-03 MED ORDER — SODIUM CHLORIDE 0.9 % IV SOLN
Freq: Once | INTRAVENOUS | Status: AC
  Administered 2022-01-03: 510 mL
  Filled 2022-01-03: qty 10

## 2022-01-03 MED ORDER — LACTATED RINGERS IV SOLN: INTRAVENOUS | Status: DC

## 2022-01-03 MED ORDER — ONDANSETRON HCL 4 MG/2ML IJ SOLN
INTRAMUSCULAR | Status: DC | PRN
  Administered 2022-01-03: 4 mg via INTRAVENOUS

## 2022-01-03 MED ORDER — DEXAMETHASONE SODIUM PHOSPHATE 10 MG/ML IJ SOLN
INTRAMUSCULAR | Status: DC | PRN
  Administered 2022-01-03: 10 mg via INTRAVENOUS

## 2022-01-03 MED ORDER — BUPIVACAINE-EPINEPHRINE (PF) 0.25% -1:200000 IJ SOLN
INTRAMUSCULAR | Status: AC
  Filled 2022-01-03: qty 30

## 2022-01-03 MED ORDER — FENTANYL CITRATE (PF) 100 MCG/2ML IJ SOLN: 25.0000 ug | INTRAMUSCULAR | Status: DC | PRN

## 2022-01-03 MED ORDER — OXYCODONE HCL 5 MG PO TABS
5.0000 mg | ORAL_TABLET | Freq: Four times a day (QID) | ORAL | 0 refills | Status: DC | PRN
  Filled 2022-01-03: qty 5, 2d supply, fill #0

## 2022-01-03 MED ORDER — PROMETHAZINE HCL 25 MG/ML IJ SOLN: 6.2500 mg | INTRAMUSCULAR | Status: DC | PRN

## 2022-01-03 SURGICAL SUPPLY — 32 items
BAG COUNTER SPONGE SURGICOUNT (BAG) ×1 IMPLANT
BAG DECANTER FOR FLEXI CONT (MISCELLANEOUS) IMPLANT
BENZOIN TINCTURE PRP APPL 2/3 (GAUZE/BANDAGES/DRESSINGS) ×1 IMPLANT
CANISTER SUCT 3000ML PPV (MISCELLANEOUS) ×1 IMPLANT
CHLORAPREP W/TINT 26 (MISCELLANEOUS) ×1 IMPLANT
COVER SURGICAL LIGHT HANDLE (MISCELLANEOUS) ×1 IMPLANT
DERMABOND ADVANCED .7 DNX12 (GAUZE/BANDAGES/DRESSINGS) IMPLANT
DRAPE LAPAROSCOPIC ABDOMINAL (DRAPES) IMPLANT
ELECT REM PT RETURN 9FT ADLT (ELECTROSURGICAL) ×1
ELECTRODE REM PT RTRN 9FT ADLT (ELECTROSURGICAL) ×1 IMPLANT
GLOVE BIO SURGEON STRL SZ 6 (GLOVE) ×1 IMPLANT
GLOVE INDICATOR 6.5 STRL GRN (GLOVE) ×1 IMPLANT
GOWN STRL REUS W/ TWL LRG LVL3 (GOWN DISPOSABLE) ×1 IMPLANT
GOWN STRL REUS W/TWL 2XL LVL3 (GOWN DISPOSABLE) ×1 IMPLANT
GOWN STRL REUS W/TWL LRG LVL3 (GOWN DISPOSABLE) ×1
KIT BASIN OR (CUSTOM PROCEDURE TRAY) ×1 IMPLANT
KIT TURNOVER KIT B (KITS) ×1 IMPLANT
NDL HYPO 25GX1X1/2 BEV (NEEDLE) ×1 IMPLANT
NEEDLE HYPO 25GX1X1/2 BEV (NEEDLE) ×1 IMPLANT
NS IRRIG 1000ML POUR BTL (IV SOLUTION) ×1 IMPLANT
PACK GENERAL/GYN (CUSTOM PROCEDURE TRAY) ×1 IMPLANT
PAD ARMBOARD 7.5X6 YLW CONV (MISCELLANEOUS) ×2 IMPLANT
SPECIMEN JAR SMALL (MISCELLANEOUS) ×1 IMPLANT
STRIP CLOSURE SKIN 1/4X3 (GAUZE/BANDAGES/DRESSINGS) IMPLANT
SUT MON AB 4-0 PC3 18 (SUTURE) ×1 IMPLANT
SUT VIC AB 2-0 SH 27 (SUTURE) ×1
SUT VIC AB 2-0 SH 27XBRD (SUTURE) IMPLANT
SUT VIC AB 3-0 SH 27 (SUTURE) ×1
SUT VIC AB 3-0 SH 27X BRD (SUTURE) ×1 IMPLANT
SYR CONTROL 10ML LL (SYRINGE) ×1 IMPLANT
TOWEL GREEN STERILE (TOWEL DISPOSABLE) ×1 IMPLANT
TOWEL GREEN STERILE FF (TOWEL DISPOSABLE) ×1 IMPLANT

## 2022-01-03 NOTE — Transfer of Care (Signed)
Immediate Anesthesia Transfer of Care Note  Patient: Haley Calderon  Procedure(s) Performed: EXCISION OF RIGHT BREAST MASS (Right: Breast)  Patient Location: PACU  Anesthesia Type:General  Level of Consciousness: drowsy and patient cooperative  Airway & Oxygen Therapy: Patient Spontanous Breathing and Patient connected to face mask oxygen  Post-op Assessment: Report given to RN and Post -op Vital signs reviewed and stable  Post vital signs: Reviewed and stable  Last Vitals:  Vitals Value Taken Time  BP 110/57 01/03/22 1022  Temp    Pulse 56 01/03/22 1024  Resp 17 01/03/22 1024  SpO2 100 % 01/03/22 1024  Vitals shown include unvalidated device data.  Last Pain:  Vitals:   01/03/22 0719  TempSrc:   PainSc: 0-No pain      Patients Stated Pain Goal: 0 (12/31/55 4734)  Complications: No notable events documented.

## 2022-01-03 NOTE — Discharge Instructions (Signed)
[facility] Office Phone Number [phone]  BREAST BIOPSY/ PARTIAL MASTECTOMY: POST OP INSTRUCTIONS  Always review your discharge instruction sheet given to you by the facility where your surgery was performed.  IF YOU HAVE DISABILITY OR FAMILY LEAVE FORMS, YOU MUST BRING THEM TO THE OFFICE FOR PROCESSING.  DO NOT GIVE THEM TO YOUR DOCTOR.  Take 2 tylenol (acetominophen) three times a day for 3 days.  If you still have pain, add ibuprofen with food in between if able to take this (if you have kidney issues or stomach issues, do not take ibuprofen).  If both of those are not enough, add the narcotic pain pill.  If you find you are needing a lot of this overnight after surgery, call the next morning for a refill.    Prescriptions will not be filled after 5pm or on week-ends. Take your usually prescribed medications unless otherwise directed You should eat very light the first 24 hours after surgery, such as soup, crackers, pudding, etc.  Resume your normal diet the day after surgery. Most patients will experience some swelling and bruising in the breast.  Ice packs and a good support bra will help.  Swelling and bruising can take several days to resolve.  It is common to experience some constipation if taking pain medication after surgery.  Increasing fluid intake and taking a stool softener will usually help or prevent this problem from occurring.  A mild laxative (Milk of Magnesia or Miralax) should be taken according to package directions if there are no bowel movements after 48 hours. Unless discharge instructions indicate otherwise, you may remove your bandages 48 hours after surgery, and you may shower at that time.  You may have steri-strips (small skin tapes) in place directly over the incision.  These strips should be left on the skin at least for for 7-10 days.    ACTIVITIES:  You may resume regular daily activities (gradually increasing) beginning the next day.  Wearing a  good support bra or sports bra (or the breast binder) minimizes pain and swelling.  You may have sexual intercourse when it is comfortable. No heavy lifting for 1-2 weeks (not over around 10 pounds).  You may drive when you no longer are taking prescription pain medication, you can comfortably wear a seatbelt, and you can safely maneuver your car and apply brakes. RETURN TO WORK:  __________3-14 days depending on job. _______________ You should see your doctor in the office for a follow-up appointment approximately two weeks after your surgery.  Your doctor's nurse will typically make your follow-up appointment when she calls you with your pathology report.  Expect your pathology report 3-4 business days after your surgery.  You may call to check if you do not hear from us after three days.   WHEN TO CALL YOUR DOCTOR: Fever over 101.0 Nausea and/or vomiting. Extreme swelling or bruising. Continued bleeding from incision. Increased pain, redness, or drainage from the incision.  The clinic staff is available to answer your questions during regular business hours.  Please don't hesitate to call and ask to speak to one of the nurses for clinical concerns.  If you have a medical emergency, go to the nearest emergency room or call 911.  A surgeon from [facility] is always on call at the hospital.  For further questions, please visit [email or web address]   

## 2022-01-03 NOTE — Op Note (Signed)
Excisional of breast mass  Indications: This patient presents with history of right breast mass, appears to be recurrent infected sebaceous cyst, non actively infected now  Pre-operative Diagnosis: left breast mass  Post-operative Diagnosis: left breast mass  Surgeon: Stark Klein   Anesthesia: General LMA anesthesia  ASA Class: 2  Procedure Details  The patient was seen in the Holding Room. The risks, benefits, complications, treatment options, and expected outcomes were discussed with the patient. The possibilities of reaction to medication, pulmonary aspiration, bleeding, infection, the need for additional procedures, failure to diagnose a condition, and creating a complication requiring transfusion or operation were discussed with the patient. The patient concurred with the proposed plan, giving informed consent.  The site of surgery properly noted/marked. The patient was taken to Operating Room # 2, identified, and the procedure verified as Breast Excisional Biopsy. A Time Out was held and the above information confirmed.  After induction of anesthesia, the right  breast and chest were prepped and draped in standard fashion. The excision was created by making a crescent shaped incision involving the lower outer areolar border. The mass was present here and two sinus tracts were at the areolar border.  These were all incorporated into the excision.   Dissection was carried down to soft tissue with the cautery.  The entire mass/chronic inflammatory tissue was removed intact.  Local anesthetic was infiltrated into the surrounding tissue. Hemostasis was achieved with cautery.  The wound was irrigated with antibiotic irrigation.  Several deep 2-0 vicryls were placed to close down the soft tissue.  The skin was then closed with a 3-0 Vicryl interrupted deep dermal stitch and a 4-0 Monocryl subcuticular closure in layers.    Sterile dressings were applied. At the end of the operation, all sponge,  instrument, and needle counts were correct.  Findings: grossly clear surgical margins  Estimated Blood Loss:  Minimal         Specimens: right breast mass         Complications:  None; patient tolerated the procedure well.         Disposition: PACU - hemodynamically stable.         Condition: stable

## 2022-01-03 NOTE — Anesthesia Procedure Notes (Signed)
Procedure Name: LMA Insertion Date/Time: 01/03/2022 9:27 AM  Performed by: Genelle Bal, CRNAPre-anesthesia Checklist: Patient identified, Emergency Drugs available, Suction available and Patient being monitored Patient Re-evaluated:Patient Re-evaluated prior to induction Oxygen Delivery Method: Circle system utilized Preoxygenation: Pre-oxygenation with 100% oxygen Induction Type: IV induction Ventilation: Mask ventilation without difficulty LMA: LMA inserted LMA Size: 4.0 Number of attempts: 1 Airway Equipment and Method: Bite block Placement Confirmation: positive ETCO2 Tube secured with: Tape Dental Injury: Teeth and Oropharynx as per pre-operative assessment

## 2022-01-03 NOTE — Anesthesia Preprocedure Evaluation (Addendum)
Anesthesia Evaluation  Patient identified by MRN, date of birth, ID band Patient awake    Reviewed: Allergy & Precautions, NPO status , Patient's Chart, lab work & pertinent test results  History of Anesthesia Complications Negative for: history of anesthetic complications  Airway Mallampati: II  TM Distance: >3 FB Neck ROM: Full    Dental  (+) Teeth Intact, Dental Advisory Given, Caps   Pulmonary Current SmokerPatient did not abstain from smoking.,    Pulmonary exam normal breath sounds clear to auscultation       Cardiovascular Exercise Tolerance: Good negative cardio ROS Normal cardiovascular exam Rhythm:Regular Rate:Normal     Neuro/Psych  Headaches, negative psych ROS   GI/Hepatic Neg liver ROS, GERD  Medicated,  Endo/Other  negative endocrine ROS  Renal/GU negative Renal ROS     Musculoskeletal  (+) Arthritis , Raynaud disease   Abdominal   Peds  Hematology negative hematology ROS (+)   Anesthesia Other Findings Day of surgery medications reviewed with the patient.  Right breast mass  Reproductive/Obstetrics negative OB ROS                            Anesthesia Physical Anesthesia Plan  ASA: 2  Anesthesia Plan: General   Post-op Pain Management: Tylenol PO (pre-op)*   Induction: Intravenous  PONV Risk Score and Plan: 2 and Midazolam, Dexamethasone and Ondansetron  Airway Management Planned: LMA  Additional Equipment:   Intra-op Plan:   Post-operative Plan: Extubation in OR  Informed Consent: I have reviewed the patients History and Physical, chart, labs and discussed the procedure including the risks, benefits and alternatives for the proposed anesthesia with the patient or authorized representative who has indicated his/her understanding and acceptance.     Dental advisory given  Plan Discussed with: CRNA  Anesthesia Plan Comments:        Anesthesia  Quick Evaluation

## 2022-01-03 NOTE — Interval H&P Note (Signed)
History and Physical Interval Note:  01/03/2022 8:00 AM  Haley Calderon  has presented today for surgery, with the diagnosis of RIGHT BREAST MASS.  The various methods of treatment have been discussed with the patient and family. After consideration of risks, benefits and other options for treatment, the patient has consented to  Procedure(s): EXCISION OF RIGHT BREAST MASS (Right) as a surgical intervention.  The patient's history has been reviewed, patient examined, no change in status, stable for surgery.  I have reviewed the patient's chart and labs.  Questions were answered to the patient's satisfaction.     Stark Klein

## 2022-01-04 ENCOUNTER — Encounter (HOSPITAL_COMMUNITY): Payer: Self-pay | Admitting: General Surgery

## 2022-01-04 NOTE — Anesthesia Postprocedure Evaluation (Signed)
Anesthesia Post Note  Patient: Haley Calderon  Procedure(s) Performed: EXCISION OF RIGHT BREAST MASS (Right: Breast)     Patient location during evaluation: PACU Anesthesia Type: General Level of consciousness: awake and alert Pain management: pain level controlled Vital Signs Assessment: post-procedure vital signs reviewed and stable Respiratory status: spontaneous breathing, nonlabored ventilation, respiratory function stable and patient connected to nasal cannula oxygen Cardiovascular status: blood pressure returned to baseline and stable Postop Assessment: no apparent nausea or vomiting Anesthetic complications: no   No notable events documented.  Last Vitals:  Vitals:   01/03/22 1040 01/03/22 1055  BP: 120/65 111/79  Pulse: 70 64  Resp: 15 15  Temp:  36.6 C  SpO2: 99% 100%    Last Pain:  Vitals:   01/03/22 1055  TempSrc:   PainSc: 0-No pain                 Santa Lighter

## 2022-01-05 LAB — SURGICAL PATHOLOGY

## 2022-02-10 ENCOUNTER — Ambulatory Visit (INDEPENDENT_AMBULATORY_CARE_PROVIDER_SITE_OTHER): Payer: Managed Care, Other (non HMO)

## 2022-02-10 DIAGNOSIS — Z23 Encounter for immunization: Secondary | ICD-10-CM | POA: Diagnosis not present

## 2022-02-16 ENCOUNTER — Encounter: Payer: Self-pay | Admitting: Family Medicine

## 2022-02-28 ENCOUNTER — Encounter: Payer: Managed Care, Other (non HMO) | Admitting: Family Medicine

## 2022-03-07 ENCOUNTER — Ambulatory Visit (INDEPENDENT_AMBULATORY_CARE_PROVIDER_SITE_OTHER): Payer: Managed Care, Other (non HMO) | Admitting: Psychiatry

## 2022-03-07 VITALS — BP 126/78 | HR 63 | Ht 70.0 in | Wt 154.0 lb

## 2022-03-07 DIAGNOSIS — G43119 Migraine with aura, intractable, without status migrainosus: Secondary | ICD-10-CM | POA: Diagnosis not present

## 2022-03-07 MED ORDER — TIZANIDINE HCL 4 MG PO TABS: 6.0000 mg | ORAL_TABLET | Freq: Four times a day (QID) | ORAL | 11 refills | Status: DC | PRN

## 2022-03-07 MED ORDER — AJOVY 225 MG/1.5ML ~~LOC~~ SOSY: PREFILLED_SYRINGE | SUBCUTANEOUS | 11 refills | Status: DC

## 2022-03-07 MED ORDER — RIZATRIPTAN BENZOATE 10 MG PO TABS: 10.0000 mg | ORAL_TABLET | ORAL | 11 refills | Status: DC | PRN
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
## 2022-03-30 ENCOUNTER — Ambulatory Visit: Payer: Managed Care, Other (non HMO) | Admitting: Psychiatry

## 2022-04-06 ENCOUNTER — Encounter: Payer: Self-pay | Admitting: Family Medicine

## 2022-04-06 ENCOUNTER — Ambulatory Visit (INDEPENDENT_AMBULATORY_CARE_PROVIDER_SITE_OTHER): Payer: Managed Care, Other (non HMO) | Admitting: Family Medicine

## 2022-04-06 VITALS — BP 114/72 | HR 60 | Temp 98.4°F | Ht 69.75 in | Wt 154.6 lb

## 2022-04-06 DIAGNOSIS — Z0000 Encounter for general adult medical examination without abnormal findings: Secondary | ICD-10-CM

## 2022-04-06 LAB — LIPID PANEL
Cholesterol: 193 mg/dL (ref 0–200)
HDL: 69.5 mg/dL (ref >39.00)
LDL Cholesterol: 106 mg/dL — ABNORMAL HIGH (ref 0–99)
NonHDL: 123.34
Total CHOL/HDL Ratio: 3
Triglycerides: 88 mg/dL (ref 0.0–149.0)
VLDL: 17.6 mg/dL (ref 0.0–40.0)

## 2022-04-06 LAB — COMPREHENSIVE METABOLIC PANEL
ALT: 13 U/L (ref 0–35)
AST: 16 U/L (ref 0–37)
Albumin: 4.3 g/dL (ref 3.5–5.2)
Alkaline Phosphatase: 50 U/L (ref 39–117)
BUN: 7 mg/dL (ref 6–23)
CO2: 28 mEq/L (ref 19–32)
Calcium: 9.1 mg/dL (ref 8.4–10.5)
Chloride: 104 mEq/L (ref 96–112)
Creatinine, Ser: 0.72 mg/dL (ref 0.40–1.20)
GFR: 103 mL/min (ref >60.00)
Glucose, Bld: 89 mg/dL (ref 70–99)
Potassium: 4.4 mEq/L (ref 3.5–5.1)
Sodium: 138 mEq/L (ref 135–145)
Total Bilirubin: 0.7 mg/dL (ref 0.2–1.2)
Total Protein: 6.5 g/dL (ref 6.0–8.3)

## 2022-04-06 LAB — CBC
HCT: 42.7 % (ref 36.0–46.0)
Hemoglobin: 14.6 g/dL (ref 12.0–15.0)
MCHC: 34.1 g/dL (ref 30.0–36.0)
MCV: 94.6 fl (ref 78.0–100.0)
Platelets: 201 10*3/uL (ref 150.0–400.0)
RBC: 4.52 Mil/uL (ref 3.87–5.11)
RDW: 13.2 % (ref 11.5–15.5)
WBC: 12 10*3/uL — ABNORMAL HIGH (ref 4.0–10.5)

## 2022-04-06 LAB — TSH: TSH: 1.09 u[IU]/mL (ref 0.35–5.50)

## 2022-04-06 NOTE — Patient Instructions (Signed)
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
## 2022-04-07 ENCOUNTER — Telehealth: Payer: Self-pay

## 2022-04-07 DIAGNOSIS — D72829 Elevated white blood cell count, unspecified: Secondary | ICD-10-CM

## 2022-04-07 NOTE — Telephone Encounter (Signed)
-----   Message from Tammi Sou, MD sent at 04/07/2022  8:11 AM EST ----- Labs are normal except white blood cell count just a little bit elevated. This may be associated with the current viral respiratory infection she has.  I recommend she come back for repeat CBC with differential in 1 month, diagnosis leukocytosis.

## 2022-05-08 ENCOUNTER — Other Ambulatory Visit (INDEPENDENT_AMBULATORY_CARE_PROVIDER_SITE_OTHER): Payer: Managed Care, Other (non HMO)

## 2022-05-08 DIAGNOSIS — D72829 Elevated white blood cell count, unspecified: Secondary | ICD-10-CM | POA: Diagnosis not present

## 2022-05-08 LAB — CBC WITH DIFFERENTIAL/PLATELET
Basophils Absolute: 0 10*3/uL (ref 0.0–0.1)
Basophils Relative: 0.3 % (ref 0.0–3.0)
Eosinophils Absolute: 0.1 10*3/uL (ref 0.0–0.7)
Eosinophils Relative: 1.1 % (ref 0.0–5.0)
HCT: 43.1 % (ref 36.0–46.0)
Hemoglobin: 14.7 g/dL (ref 12.0–15.0)
Lymphocytes Relative: 24 % (ref 12.0–46.0)
Lymphs Abs: 2.1 10*3/uL (ref 0.7–4.0)
MCHC: 34.2 g/dL (ref 30.0–36.0)
MCV: 96.3 fl (ref 78.0–100.0)
Monocytes Absolute: 0.4 10*3/uL (ref 0.1–1.0)
Monocytes Relative: 4.4 % (ref 3.0–12.0)
Neutro Abs: 6.3 10*3/uL (ref 1.4–7.7)
Neutrophils Relative %: 70.2 % (ref 43.0–77.0)
Platelets: 182 10*3/uL (ref 150.0–400.0)
RBC: 4.47 Mil/uL (ref 3.87–5.11)
RDW: 13.7 % (ref 11.5–15.5)
WBC: 9 10*3/uL (ref 4.0–10.5)

## 2022-06-20 ENCOUNTER — Ambulatory Visit: Payer: Managed Care, Other (non HMO) | Admitting: Family Medicine

## 2022-06-20 ENCOUNTER — Encounter: Payer: Self-pay | Admitting: Family Medicine

## 2022-06-20 VITALS — BP 115/77 | HR 70 | Temp 98.2°F | Wt 155.6 lb

## 2022-06-20 DIAGNOSIS — S2341XA Sprain of ribs, initial encounter: Secondary | ICD-10-CM | POA: Diagnosis not present
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
## 2022-06-20 NOTE — Patient Instructions (Signed)
    Try naproxen or ibuprofen for discomfort. Exam is more consistent with muscle strain.  Your lungs sound great

## 2022-06-21 ENCOUNTER — Other Ambulatory Visit: Payer: Self-pay | Admitting: Nurse Practitioner

## 2022-06-21 DIAGNOSIS — Z1231 Encounter for screening mammogram for malignant neoplasm of breast: Secondary | ICD-10-CM

## 2022-08-08 ENCOUNTER — Ambulatory Visit
Admission: RE | Admit: 2022-08-08 | Discharge: 2022-08-08 | Disposition: A | Discharge status: Home / Self Care | Payer: Managed Care, Other (non HMO) | Source: Ambulatory Visit | Attending: Nurse Practitioner | Admitting: Nurse Practitioner

## 2022-08-08 ENCOUNTER — Other Ambulatory Visit: Payer: Self-pay | Admitting: Obstetrics and Gynecology

## 2022-08-08 DIAGNOSIS — Z1231 Encounter for screening mammogram for malignant neoplasm of breast: Secondary | ICD-10-CM

## 2022-08-09 ENCOUNTER — Other Ambulatory Visit: Payer: Self-pay | Admitting: Family

## 2022-08-09 DIAGNOSIS — N6310 Unspecified lump in the right breast, unspecified quadrant: Secondary | ICD-10-CM

## 2022-09-08 ENCOUNTER — Other Ambulatory Visit: Payer: Managed Care, Other (non HMO)

## 2022-10-25 ENCOUNTER — Telehealth: Payer: Self-pay | Admitting: Psychiatry

## 2022-10-25 NOTE — Telephone Encounter (Signed)
Mychart reschedule Haley Calderon patient

## 2022-12-01 ENCOUNTER — Telehealth: Payer: Self-pay

## 2022-12-01 NOTE — Telephone Encounter (Signed)
Patient called stating she wanted to schedule a colonoscopy. Patient stated her last was in 2019 and would be bring her records for review and is requesting to be seen with Dr. Angelina Pih. Patient is requesting transfer because her mom is a patient of his.

## 2023-01-18 ENCOUNTER — Telehealth: Payer: Self-pay | Admitting: Gastroenterology

## 2023-01-18 NOTE — Telephone Encounter (Signed)
Good morning Dr. Barron Alvine,   We received a call from this patient wishing to schedule a colonoscopy. Patient stated her last was in 2019 in [place], [place]. Patient is requesting transfer because her mom is a patient of yours. Records were obtained and scanned into Media for your review. Would you please advise on scheduling?  Thank you.

## 2023-01-24 ENCOUNTER — Encounter: Payer: Self-pay | Admitting: Gastroenterology

## 2023-02-22 ENCOUNTER — Ambulatory Visit: Payer: Managed Care, Other (non HMO)

## 2023-02-22 VITALS — Ht 69.75 in | Wt 158.0 lb

## 2023-02-22 DIAGNOSIS — Z860100 Personal history of colon polyps, unspecified: Secondary | ICD-10-CM

## 2023-02-22 MED ORDER — NA SULFATE-K SULFATE-MG SULF 17.5-3.13-1.6 GM/177ML PO SOLN: 1.0000 | Freq: Once | ORAL | 0 refills | Status: AC

## 2023-02-22 NOTE — Progress Notes (Signed)

## 2023-03-07 ENCOUNTER — Encounter: Payer: Self-pay | Admitting: Gastroenterology
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
## 2023-03-13 ENCOUNTER — Ambulatory Visit: Payer: Managed Care, Other (non HMO) | Admitting: Adult Health

## 2023-03-13 ENCOUNTER — Ambulatory Visit: Payer: Managed Care, Other (non HMO) | Admitting: Psychiatry

## 2023-03-14 ENCOUNTER — Encounter: Payer: Self-pay | Admitting: Adult Health

## 2023-03-14 ENCOUNTER — Ambulatory Visit: Payer: Managed Care, Other (non HMO) | Admitting: Adult Health

## 2023-03-14 VITALS — BP 128/84 | HR 62 | Ht 69.0 in | Wt 159.5 lb

## 2023-03-14 DIAGNOSIS — R252 Cramp and spasm: Secondary | ICD-10-CM

## 2023-03-14 DIAGNOSIS — G43119 Migraine with aura, intractable, without status migrainosus: Secondary | ICD-10-CM | POA: Diagnosis not present

## 2023-03-14 DIAGNOSIS — I7300 Raynaud's syndrome without gangrene: Secondary | ICD-10-CM | POA: Diagnosis not present

## 2023-03-14 MED ORDER — RIZATRIPTAN BENZOATE 10 MG PO TABS: 10.0000 mg | ORAL_TABLET | ORAL | 11 refills | Status: DC | PRN

## 2023-03-14 MED ORDER — TIZANIDINE HCL 4 MG PO TABS: 6.0000 mg | ORAL_TABLET | Freq: Four times a day (QID) | ORAL | 11 refills | Status: DC | PRN

## 2023-03-14 MED ORDER — AJOVY 225 MG/1.5ML ~~LOC~~ SOSY: PREFILLED_SYRINGE | SUBCUTANEOUS | 11 refills | Status: DC

## 2023-03-14 NOTE — Patient Instructions (Addendum)
Your Plan:  Restart Ajovy if migraines return, continue use of Maxalt as needed  We will check lab work today to look for other causes   Can try a different type of magnesium and add B complex or vitamin E - if symptoms persist, can consider sleep evaluation to rule out any underlying sleep disorder      Follow up in 1 year or call earlier if needed     Thank you for coming to see [place] at [facility]. I hope we have been able to provide you high quality care today.  You may receive a patient satisfaction survey over the next few weeks. We would appreciate your feedback and comments so that we may continue to improve ourselves and the health of our patients.
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
## 2023-03-15 ENCOUNTER — Other Ambulatory Visit: Payer: Self-pay | Admitting: Adult Health

## 2023-03-15 DIAGNOSIS — R768 Other specified abnormal immunological findings in serum: Secondary | ICD-10-CM

## 2023-03-15 DIAGNOSIS — I7300 Raynaud's syndrome without gangrene: Secondary | ICD-10-CM

## 2023-03-15 DIAGNOSIS — R252 Cramp and spasm: Secondary | ICD-10-CM

## 2023-03-15 LAB — COMPREHENSIVE METABOLIC PANEL
ALT: 14 [IU]/L (ref 0–32)
AST: 17 [IU]/L (ref 0–40)
Albumin: 4.6 g/dL (ref 3.9–4.9)
Alkaline Phosphatase: 62 [IU]/L (ref 44–121)
BUN/Creatinine Ratio: 9 (ref 9–23)
BUN: 7 mg/dL (ref 6–24)
Bilirubin Total: 0.4 mg/dL (ref 0.0–1.2)
CO2: 21 mmol/L (ref 20–29)
Calcium: 9.1 mg/dL (ref 8.7–10.2)
Chloride: 104 mmol/L (ref 96–106)
Creatinine, Ser: 0.8 mg/dL (ref 0.57–1.00)
Globulin, Total: 2 g/dL (ref 1.5–4.5)
Glucose: 90 mg/dL (ref 70–99)
Potassium: 4.6 mmol/L (ref 3.5–5.2)
Sodium: 139 mmol/L (ref 134–144)
Total Protein: 6.6 g/dL (ref 6.0–8.5)
eGFR: 94 mL/min/{1.73_m2} (ref >59)

## 2023-03-15 LAB — IRON,TIBC AND FERRITIN PANEL
Ferritin: 131 ng/mL (ref 15–150)
Iron Saturation: 27 % (ref 15–55)
Iron: 91 ug/dL (ref 27–159)
Total Iron Binding Capacity: 333 ug/dL (ref 250–450)
UIBC: 242 ug/dL (ref 131–425)

## 2023-03-15 LAB — ENA+DNA/DS+SJORGEN'S
ENA RNP Ab: <0.2 AI (ref 0.0–0.9)
ENA SM Ab Ser-aCnc: <0.2 AI (ref 0.0–0.9)
ENA SSA (RO) Ab: <0.2 AI (ref 0.0–0.9)
ENA SSB (LA) Ab: <0.2 AI (ref 0.0–0.9)
dsDNA Ab: 18 [IU]/mL — ABNORMAL HIGH (ref 0–9)

## 2023-03-15 LAB — MAGNESIUM: Magnesium: 2.2 mg/dL (ref 1.6–2.3)

## 2023-03-15 LAB — SEDIMENTATION RATE: Sed Rate: 2 mm/h (ref 0–32)

## 2023-03-15 LAB — ANA W/REFLEX: Anti Nuclear Antibody (ANA): POSITIVE — AB

## 2023-03-15 LAB — C-REACTIVE PROTEIN: CRP: <1 mg/L (ref 0–10)

## 2023-03-20 ENCOUNTER — Encounter: Payer: Self-pay | Admitting: Gastroenterology

## 2023-03-20 ENCOUNTER — Ambulatory Visit: Payer: Managed Care, Other (non HMO) | Admitting: Gastroenterology

## 2023-03-20 VITALS — BP 114/69 | HR 60 | Temp 98.0°F | Resp 16 | Ht 69.75 in | Wt 158.0 lb

## 2023-03-20 DIAGNOSIS — K621 Rectal polyp: Secondary | ICD-10-CM | POA: Diagnosis not present

## 2023-03-20 DIAGNOSIS — K5730 Diverticulosis of large intestine without perforation or abscess without bleeding: Secondary | ICD-10-CM

## 2023-03-20 DIAGNOSIS — Z1211 Encounter for screening for malignant neoplasm of colon: Secondary | ICD-10-CM | POA: Diagnosis present

## 2023-03-20 DIAGNOSIS — Z860100 Personal history of colon polyps, unspecified: Secondary | ICD-10-CM

## 2023-03-20 DIAGNOSIS — K635 Polyp of colon: Secondary | ICD-10-CM

## 2023-03-20 DIAGNOSIS — D128 Benign neoplasm of rectum: Secondary | ICD-10-CM

## 2023-03-20 MED ORDER — SODIUM CHLORIDE 0.9 % IV SOLN: 500.0000 mL | Freq: Once | INTRAVENOUS | Status: DC

## 2023-03-20 NOTE — Progress Notes (Signed)
Vss nad trans to pacu 

## 2023-03-20 NOTE — Progress Notes (Signed)
Pt's states no medical or surgical changes since previsit or office visit. 
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
## 2023-03-20 NOTE — Patient Instructions (Signed)
Please read handouts provided. Continue present medications. Await pathology results. Return to GI clinic as needed.   YOU HAD AN ENDOSCOPIC PROCEDURE TODAY AT THE [facility]:   Refer to the procedure report that was given to you for any specific questions about what was found during the examination.  If the procedure report does not answer your questions, please call your gastroenterologist to clarify.  If you requested that your care partner not be given the details of your procedure findings, then the procedure report has been included in a sealed envelope for you to review at your convenience later.  YOU SHOULD EXPECT: Some feelings of bloating in the abdomen. Passage of more gas than usual.  Walking can help get rid of the air that was put into your GI tract during the procedure and reduce the bloating. If you had a lower endoscopy (such as a colonoscopy or flexible sigmoidoscopy) you may notice spotting of blood in your stool or on the toilet paper. If you underwent a bowel prep for your procedure, you may not have a normal bowel movement for a few days.  Please Note:  You might notice some irritation and congestion in your nose or some drainage.  This is from the oxygen used during your procedure.  There is no need for concern and it should clear up in a day or so.  SYMPTOMS TO REPORT IMMEDIATELY:  Following lower endoscopy (colonoscopy or flexible sigmoidoscopy):  Excessive amounts of blood in the stool  Significant tenderness or worsening of abdominal pains  Swelling of the abdomen that is new, acute  Fever of 100F or higher  For urgent or emergent issues, a gastroenterologist can be reached at any hour by calling [phone]. Do not use MyChart messaging for urgent concerns.    DIET:  We do recommend a small meal at first, but then you may proceed to your regular diet.  Drink plenty of fluids but you should avoid alcoholic beverages for 24 hours.  ACTIVITY:  You  should plan to take it easy for the rest of today and you should NOT DRIVE or use heavy machinery until tomorrow (because of the sedation medicines used during the test).    FOLLOW UP: Our staff will call the number listed on your records the next business day following your procedure.  We will call around 7:15- 8:00 am to check on you and address any questions or concerns that you may have regarding the information given to you following your procedure. If we do not reach you, we will leave a message.     If any biopsies were taken you will be contacted by phone or by letter within the next 1-3 weeks.  Please call us at [phone] if you have not heard about the biopsies in 3 weeks.    SIGNATURES/CONFIDENTIALITY: You and/or your care partner have signed paperwork which will be entered into your electronic medical record.  These signatures attest to the fact that that the information above on your After Visit Summary has been reviewed and is understood.  Full responsibility of the confidentiality of this discharge information lies with you and/or your care-partner. 

## 2023-03-20 NOTE — Op Note (Signed)
[facility] Patient Name: Haley Calderon Procedure Date: 03/20/2023 10:24 AM MRN: [id number] Endoscopist: Doristine Locks , MD, [id number] Age: 42 Referring MD:  Date of Birth: [birth year] Gender: Female Account #: [id number] Procedure:                Colonoscopy Indications:              High risk colon cancer surveillance: Personal                            history of colonic polyps                           Family history notable for mother with colon                            cancer, age >55 at diagnosis.                           No recent Gi symptoms. Medicines:                Monitored Anesthesia Care Procedure:                Pre-Anesthesia Assessment:                           - Prior to the procedure, a History and Physical                            was performed, and patient medications and                            allergies were reviewed. The patient's tolerance of                            previous anesthesia was also reviewed. The risks                            and benefits of the procedure and the sedation                            options and risks were discussed with the patient.                            All questions were answered, and informed consent                            was obtained. Prior Anticoagulants: The patient has                            taken no anticoagulant or antiplatelet agents. ASA                            Grade Assessment: II - A patient with mild systemic  disease. After reviewing the risks and benefits,                            the patient was deemed in satisfactory condition to                            undergo the procedure.                           After obtaining informed consent, the colonoscope                            was passed under direct vision. Throughout the                            procedure, the patient's blood pressure, pulse, and                             oxygen saturations were monitored continuously. The                            CF [id number] #[id number] was introduced through the anus                            and advanced to the the terminal ileum. The                            colonoscopy was performed without difficulty. The                            patient tolerated the procedure well. The quality                            of the bowel preparation was good. The terminal                            ileum, ileocecal valve, appendiceal orifice, and                            rectum were photographed. Scope In: 10:46:35 AM Scope Out: 10:58:00 AM Scope Withdrawal Time: 0 hours 8 minutes 32 seconds  Total Procedure Duration: 0 hours 11 minutes 25 seconds  Findings:                 The perianal and digital rectal examinations were                            normal.                           A few small-mouthed diverticula were found in the                            sigmoid colon.  A 3 mm polyp was found in the rectum. The polyp was                            sessile. The polyp was removed with a cold snare.                            Resection and retrieval were complete. Estimated                            blood loss was minimal.                           The exam was otherwise normal throughout the                            remainder of the colon.                           The retroflexed view of the distal rectum and anal                            verge was normal and showed no anal or rectal                            abnormalities.                           The terminal ileum appeared normal. Complications:            No immediate complications. Estimated Blood Loss:     Estimated blood loss was minimal. Impression:               - Diverticulosis in the sigmoid colon.                           - One 3 mm polyp in the rectum, removed with a cold                            snare. Resected and retrieved.                            - The distal rectum and anal verge are normal on                            retroflexion view.                           - The examined portion of the ileum was normal. Recommendation:           - Patient has a contact number available for                            emergencies. The signs and symptoms of potential  delayed complications were discussed with the                            patient. Return to normal activities tomorrow.                            Written discharge instructions were provided to the                            patient.                           - Resume previous diet.                           - Continue present medications.                           - Await pathology results.                           - Repeat colonoscopy for surveillance based on                            pathology results.                           - Return to GI office PRN. Doristine Locks, MD 03/20/2023 11:02:02 AM

## 2023-03-21 ENCOUNTER — Telehealth: Payer: Self-pay

## 2023-03-21 NOTE — Telephone Encounter (Signed)
  Follow up Call-     03/20/2023    9:42 AM  Call back number  Post procedure Call Back phone  # [phone]  Permission to leave phone message Yes     Patient questions:  Do you have a fever, pain , or abdominal swelling? No. Pain Score  0 *  Have you tolerated food without any problems? Yes.    Have you been able to return to your normal activities? Yes.    Do you have any questions about your discharge instructions: Diet   No. Medications  No. Follow up visit  No.  Do you have questions or concerns about your Care? No.  Actions: * If pain score is 4 or above: No action needed, pain <4.

## 2023-03-22 ENCOUNTER — Telehealth: Payer: Self-pay

## 2023-03-22 LAB — SURGICAL PATHOLOGY

## 2023-03-22 NOTE — Telephone Encounter (Signed)
Referral faxed to [facility] (P) [phone]

## 2023-03-23 ENCOUNTER — Encounter: Payer: Self-pay | Admitting: Gastroenterology

## 2023-04-11 ENCOUNTER — Ambulatory Visit: Payer: Managed Care, Other (non HMO) | Admitting: Family Medicine

## 2023-04-11 ENCOUNTER — Encounter: Payer: Self-pay | Admitting: Family Medicine

## 2023-04-11 VITALS — BP 120/83 | HR 70 | Ht 70.0 in | Wt 164.0 lb

## 2023-04-11 DIAGNOSIS — F17200 Nicotine dependence, unspecified, uncomplicated: Secondary | ICD-10-CM | POA: Diagnosis not present

## 2023-04-11 DIAGNOSIS — Z131 Encounter for screening for diabetes mellitus: Secondary | ICD-10-CM | POA: Diagnosis not present

## 2023-04-11 DIAGNOSIS — Z13220 Encounter for screening for lipoid disorders: Secondary | ICD-10-CM

## 2023-04-11 DIAGNOSIS — Z0000 Encounter for general adult medical examination without abnormal findings: Secondary | ICD-10-CM

## 2023-04-11 DIAGNOSIS — F17210 Nicotine dependence, cigarettes, uncomplicated: Secondary | ICD-10-CM

## 2023-04-11 DIAGNOSIS — Z23 Encounter for immunization: Secondary | ICD-10-CM

## 2023-04-11 LAB — LIPID PANEL
Cholesterol: 199 mg/dL (ref 0–200)
HDL: 65.4 mg/dL (ref >39.00)
LDL Cholesterol: 119 mg/dL — ABNORMAL HIGH (ref 0–99)
NonHDL: 133.48
Total CHOL/HDL Ratio: 3
Triglycerides: 73 mg/dL (ref 0.0–149.0)
VLDL: 14.6 mg/dL (ref 0.0–40.0)

## 2023-04-11 LAB — GLUCOSE, RANDOM: Glucose, Bld: 96 mg/dL (ref 70–99)

## 2023-04-11 NOTE — Patient Instructions (Signed)
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
## 2023-06-14 IMAGING — MG MM DIGITAL SCREENING BILAT W/ TOMO AND CAD
8 series · 9 of 24 positions shown · non-contrast
Comparison: Previous exam(s).

CLINICAL DATA: Screening.

EXAM:
DIGITAL SCREENING BILATERAL MAMMOGRAM WITH TOMOSYNTHESIS AND CAD
TECHNIQUE: Bilateral screening digital craniocaudal and mediolateral oblique
mammograms were obtained. Bilateral screening digital breast
tomosynthesis was performed. The images were evaluated with
computer-aided detection.

[R CC synth-2D]
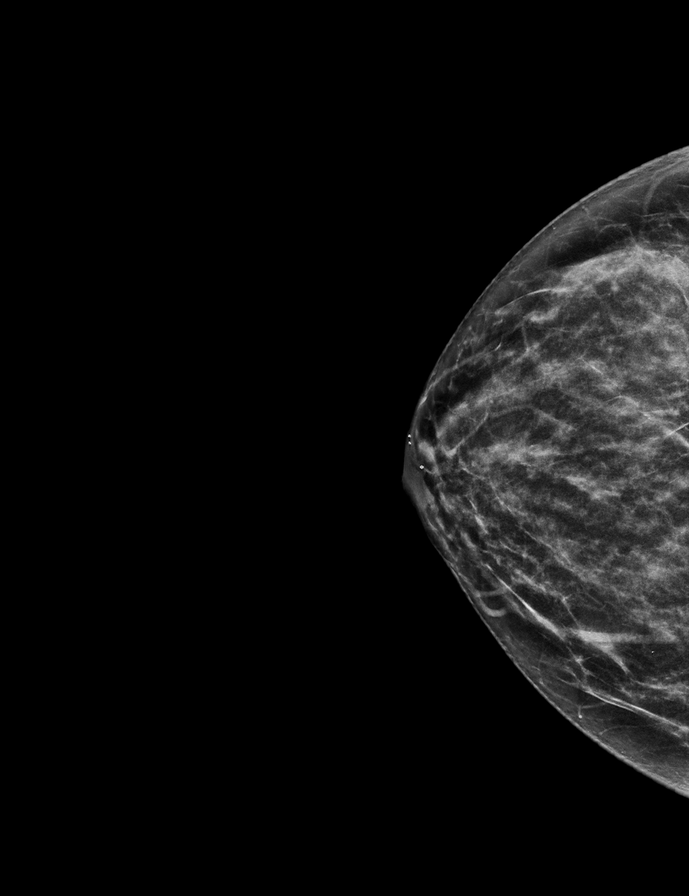

[L CC synth-2D]
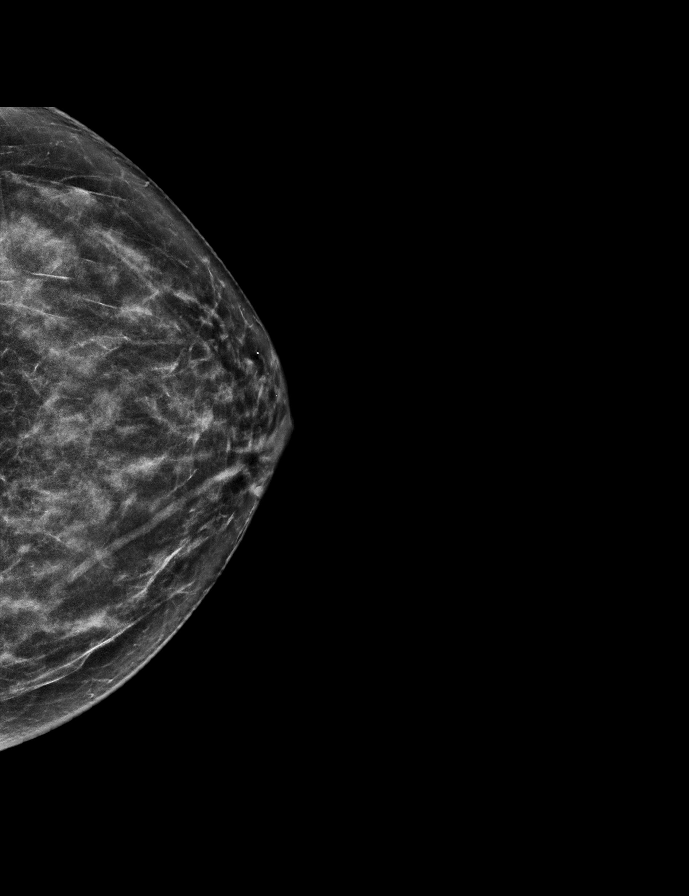

[R MLO synth-2D]
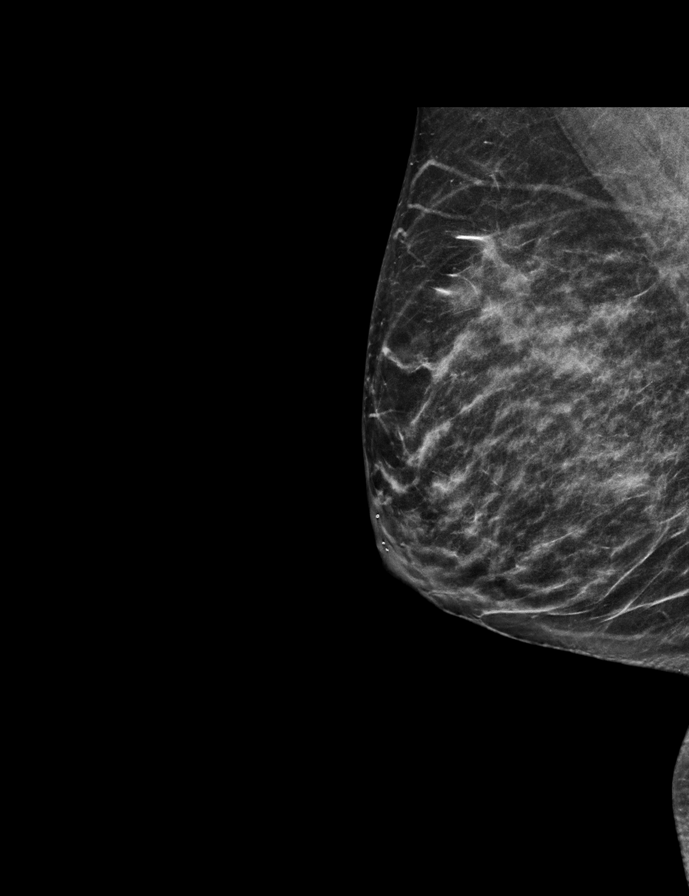

[L MLO synth-2D]
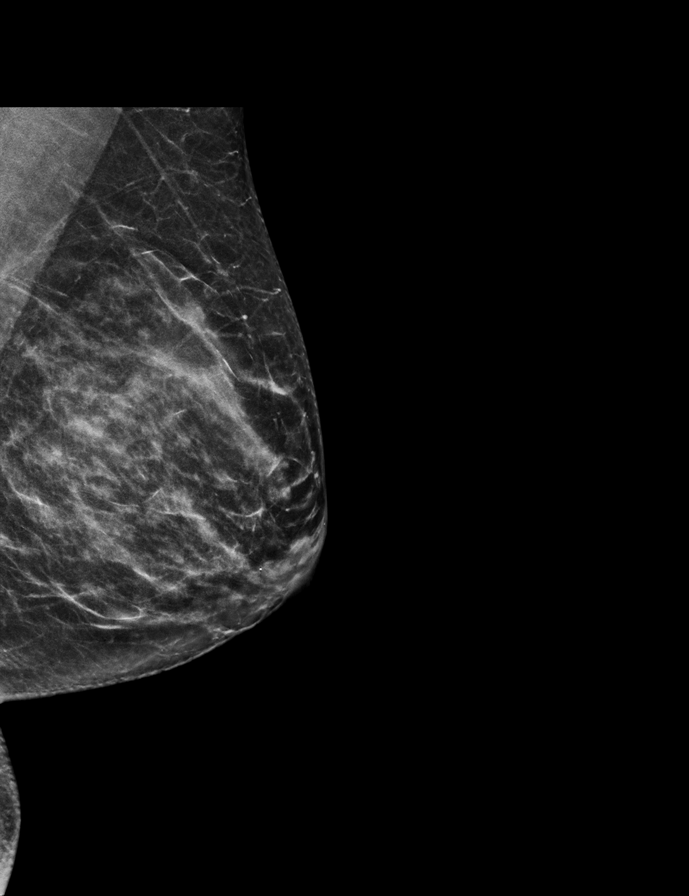

[L MLO tomo · 2 of 58 frames shown]
[frame 19/58]
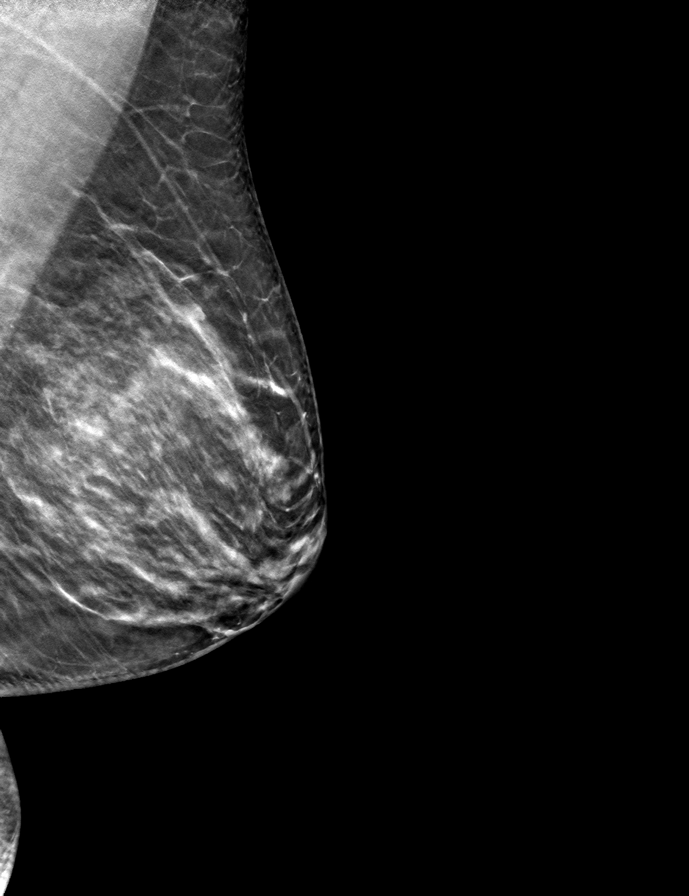
[frame 29/58]
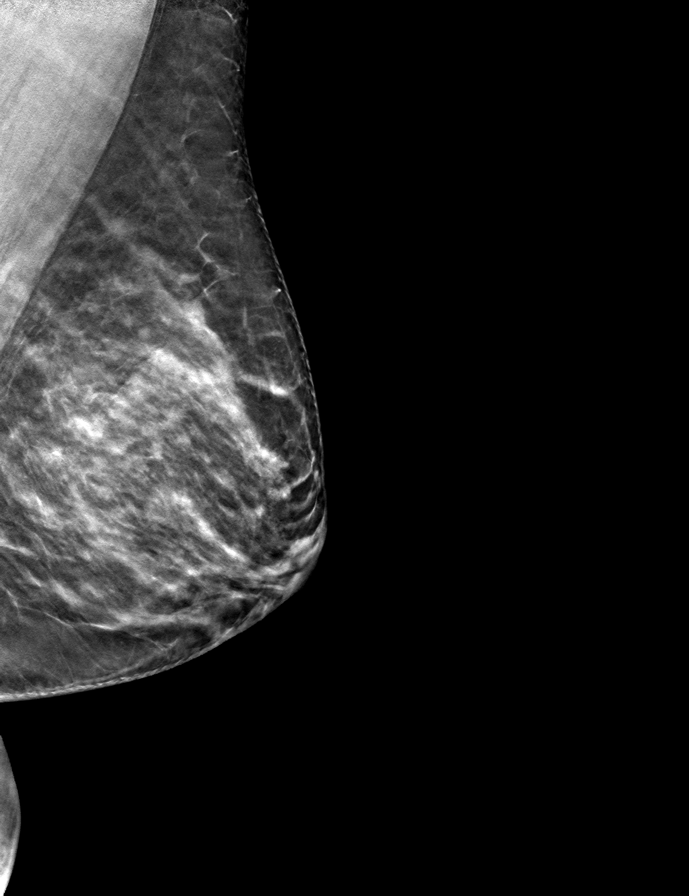

[L CC tomo · tomo slice 28/55.0]
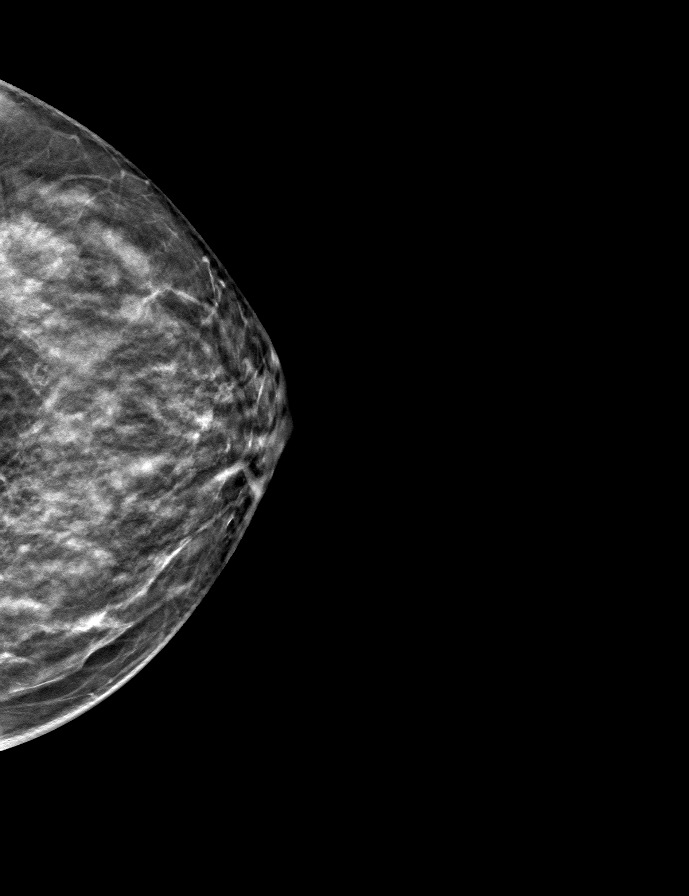

[R MLO tomo · tomo slice 27/54.0]
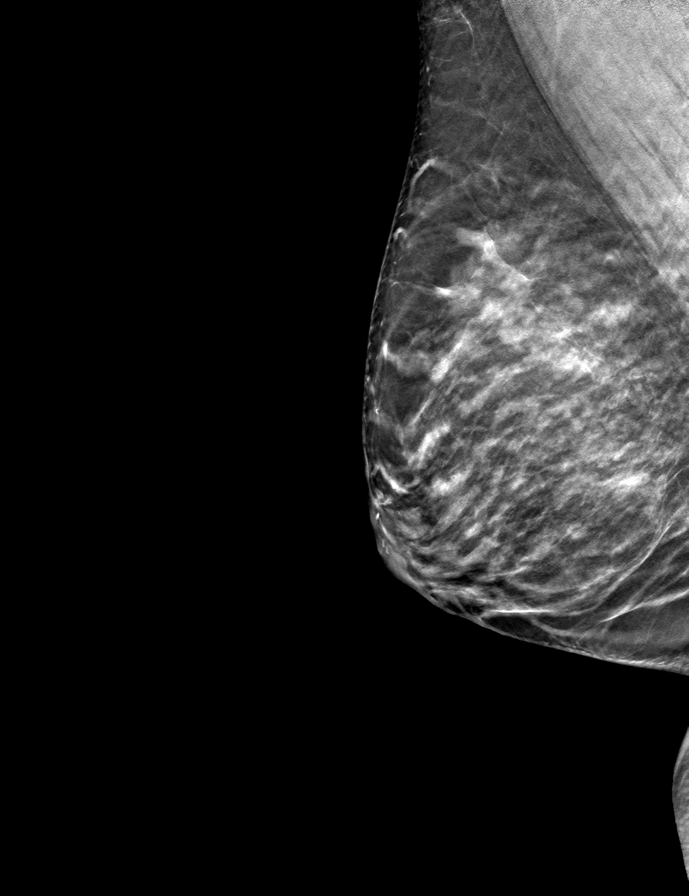

[R CC tomo · tomo slice 25/49.0]
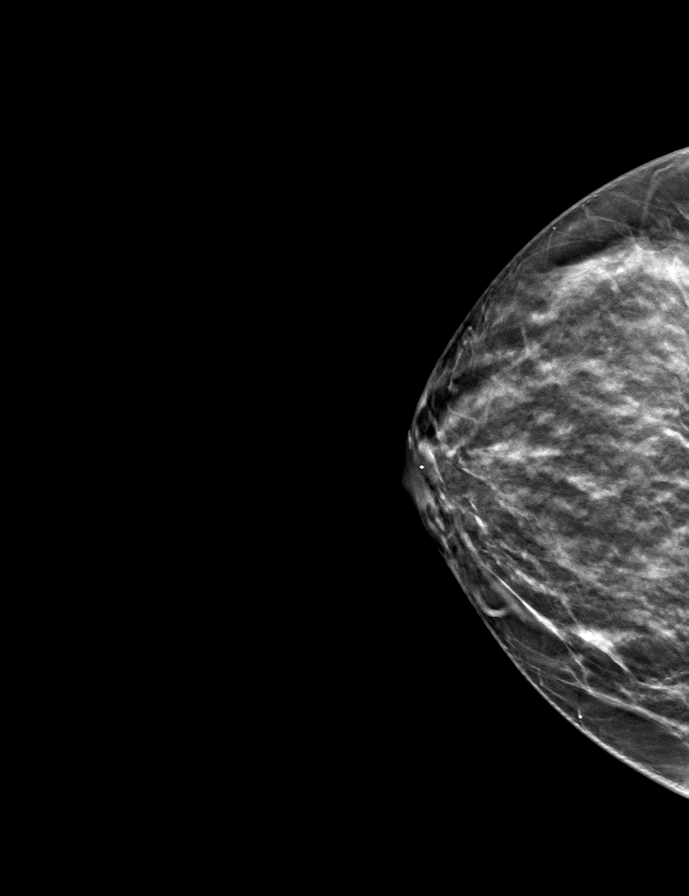

[9 of 24 positions shown; findings below may reference images not displayed]

ACR Breast Density Category c: The breast tissue is heterogeneously
dense, which may obscure small masses.
FINDINGS: There are no findings suspicious for malignancy.
IMPRESSION: No mammographic evidence of malignancy. A result letter of this
screening mammogram will be mailed directly to the patient.

RECOMMENDATION:
Screening mammogram in one year. (Code:Q3-W-BC3)

BI-RADS CATEGORY  1: Negative.

## 2023-08-20 LAB — HM MAMMOGRAPHY

## 2023-09-28 DIAGNOSIS — Z9189 Other specified personal risk factors, not elsewhere classified: Secondary | ICD-10-CM | POA: Insufficient documentation

## 2023-11-20 ENCOUNTER — Telehealth: Payer: Self-pay

## 2023-11-20 NOTE — Telephone Encounter (Signed)
 Patient brought in forms for herself and husband for wellness exams for 2025. She apologies for the inconvenience, company changed wellness forms and would not accept form that was submitted on 05/01/23.  Please fill out form and send via email: [email or web address]  (Same note on spouse's chart)

## 2023-11-21 NOTE — Telephone Encounter (Signed)
 Signed and put in box to go up front., Signed:  Gerlene Hockey, MD           11/21/2023

## 2023-11-21 NOTE — Telephone Encounter (Signed)
Placed on PCP desk to review and sign, if appropriate.  

## 2023-11-22 NOTE — Telephone Encounter (Signed)
 Form scanned back to e-mail provided below.

## 2023-12-14 ENCOUNTER — Ambulatory Visit: Admitting: Internal Medicine

## 2023-12-17 ENCOUNTER — Encounter: Payer: Self-pay | Admitting: Family Medicine

## 2023-12-17 ENCOUNTER — Ambulatory Visit: Admitting: Family Medicine

## 2023-12-17 VITALS — BP 126/74 | HR 66 | Temp 98.1°F | Wt 165.0 lb

## 2023-12-17 DIAGNOSIS — J988 Other specified respiratory disorders: Secondary | ICD-10-CM

## 2023-12-17 DIAGNOSIS — R051 Acute cough: Secondary | ICD-10-CM

## 2023-12-17 DIAGNOSIS — B9689 Other specified bacterial agents as the cause of diseases classified elsewhere: Secondary | ICD-10-CM

## 2023-12-17 DIAGNOSIS — J029 Acute pharyngitis, unspecified: Secondary | ICD-10-CM

## 2023-12-17 LAB — POCT RAPID STREP A (OFFICE): Rapid Strep A Screen: NEGATIVE

## 2023-12-17 LAB — POC COVID19 BINAXNOW: SARS Coronavirus 2 Ag: NEGATIVE

## 2023-12-17 MED ORDER — BENZONATATE 200 MG PO CAPS: 200.0000 mg | ORAL_CAPSULE | Freq: Two times a day (BID) | ORAL | 0 refills | Status: DC | PRN

## 2023-12-17 MED ORDER — DOXYCYCLINE HYCLATE 100 MG PO TABS: 100.0000 mg | ORAL_TABLET | Freq: Two times a day (BID) | ORAL | 0 refills | Status: DC

## 2023-12-17 MED ORDER — METHYLPREDNISOLONE ACETATE 80 MG/ML IJ SUSP
80.0000 mg | Freq: Once | INTRAMUSCULAR | Status: AC
  Administered 2023-12-17: 80 mg via INTRAMUSCULAR

## 2023-12-17 MED ORDER — ALBUTEROL SULFATE HFA 108 (90 BASE) MCG/ACT IN AERS: 2.0000 | INHALATION_SPRAY | Freq: Four times a day (QID) | RESPIRATORY_TRACT | 0 refills | Status: AC | PRN

## 2023-12-17 NOTE — Patient Instructions (Addendum)
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
## 2024-03-13 ENCOUNTER — Ambulatory Visit: Payer: Managed Care, Other (non HMO) | Admitting: Adult Health

## 2024-03-13 ENCOUNTER — Encounter: Payer: Self-pay | Admitting: Adult Health

## 2024-03-13 VITALS — BP 138/84 | HR 84 | Ht 69.0 in | Wt 166.0 lb

## 2024-03-13 DIAGNOSIS — G43119 Migraine with aura, intractable, without status migrainosus: Secondary | ICD-10-CM | POA: Diagnosis not present

## 2024-03-13 MED ORDER — TIZANIDINE HCL 4 MG PO TABS: 6.0000 mg | ORAL_TABLET | Freq: Four times a day (QID) | ORAL | 11 refills | Status: AC | PRN

## 2024-03-13 MED ORDER — RIZATRIPTAN BENZOATE 10 MG PO TABS: 10.0000 mg | ORAL_TABLET | ORAL | 11 refills | Status: AC | PRN

## 2024-03-13 NOTE — Patient Instructions (Addendum)
 Your Plan:  Continue rizatriptan  as needed for migraine rescue   Call with any worsening migraine headaches       Follow up in 1 year or call earlier if needed      Thank you for coming to see us  at [facility]. I hope we have been able to provide you high quality care today.  You may receive a patient satisfaction survey over the next few weeks. We would appreciate your feedback and comments so that we may continue to improve ourselves and the health of our patients.

## 2024-04-08 ENCOUNTER — Encounter: Payer: Self-pay | Admitting: Family Medicine

## 2024-04-11 ENCOUNTER — Telehealth: Payer: Self-pay

## 2024-04-11 ENCOUNTER — Encounter: Payer: Managed Care, Other (non HMO) | Admitting: Family Medicine

## 2024-04-11 ENCOUNTER — Encounter: Payer: Self-pay | Admitting: Family Medicine

## 2024-04-11 ENCOUNTER — Ambulatory Visit (INDEPENDENT_AMBULATORY_CARE_PROVIDER_SITE_OTHER): Payer: Managed Care, Other (non HMO) | Admitting: Family Medicine

## 2024-04-11 VITALS — BP 119/81 | HR 66 | Temp 97.9°F | Ht 69.0 in | Wt 164.2 lb

## 2024-04-11 DIAGNOSIS — Z0000 Encounter for general adult medical examination without abnormal findings: Secondary | ICD-10-CM

## 2024-04-11 LAB — CBC WITH DIFFERENTIAL/PLATELET
Basophils Absolute: 0 K/uL (ref 0.0–0.1)
Basophils Relative: 0.3 % (ref 0.0–3.0)
Eosinophils Absolute: 0 K/uL (ref 0.0–0.7)
Eosinophils Relative: 0.5 % (ref 0.0–5.0)
HCT: 44 % (ref 36.0–46.0)
Hemoglobin: 14.9 g/dL (ref 12.0–15.0)
Lymphocytes Relative: 21.8 % (ref 12.0–46.0)
Lymphs Abs: 2 K/uL (ref 0.7–4.0)
MCHC: 33.8 g/dL (ref 30.0–36.0)
MCV: 95.4 fl (ref 78.0–100.0)
Monocytes Absolute: 0.5 K/uL (ref 0.1–1.0)
Monocytes Relative: 5 % (ref 3.0–12.0)
Neutro Abs: 6.7 K/uL (ref 1.4–7.7)
Neutrophils Relative %: 72.4 % (ref 43.0–77.0)
Platelets: 185 K/uL (ref 150.0–400.0)
RBC: 4.61 Mil/uL (ref 3.87–5.11)
RDW: 13.4 % (ref 11.5–15.5)
WBC: 9.2 K/uL (ref 4.0–10.5)

## 2024-04-11 LAB — COMPREHENSIVE METABOLIC PANEL WITH GFR
ALT: 12 U/L (ref 3–35)
AST: 14 U/L (ref 5–37)
Albumin: 4.5 g/dL (ref 3.5–5.2)
Alkaline Phosphatase: 49 U/L (ref 39–117)
BUN: 9 mg/dL (ref 6–23)
CO2: 28 meq/L (ref 19–32)
Calcium: 9.3 mg/dL (ref 8.4–10.5)
Chloride: 105 meq/L (ref 96–112)
Creatinine, Ser: 0.75 mg/dL (ref 0.40–1.20)
GFR: 96.7 mL/min
Glucose, Bld: 96 mg/dL (ref 70–99)
Potassium: 4.3 meq/L (ref 3.5–5.1)
Sodium: 139 meq/L (ref 135–145)
Total Bilirubin: 0.8 mg/dL (ref 0.2–1.2)
Total Protein: 6.7 g/dL (ref 6.0–8.3)

## 2024-04-11 LAB — LIPID PANEL
Cholesterol: 210 mg/dL — ABNORMAL HIGH (ref 28–200)
HDL: 64.2 mg/dL
LDL Cholesterol: 126 mg/dL — ABNORMAL HIGH (ref 10–99)
NonHDL: 145.74
Total CHOL/HDL Ratio: 3
Triglycerides: 99 mg/dL (ref 10.0–149.0)
VLDL: 19.8 mg/dL (ref 0.0–40.0)

## 2024-04-11 LAB — TSH: TSH: 1.1 u[IU]/mL (ref 0.35–5.50)

## 2024-04-11 NOTE — Patient Instructions (Signed)
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
## 2024-04-11 NOTE — Telephone Encounter (Signed)
 Pt brought biometric form for completion, she would like to pick this up once completed.  Please call pt at [phone]

## 2024-04-13 ENCOUNTER — Ambulatory Visit: Payer: Self-pay | Admitting: Family Medicine

## 2024-04-21 NOTE — Telephone Encounter (Signed)
Forms completed and picked up by pt.

## 2025-03-19 ENCOUNTER — Telehealth: Admitting: Adult Health

## 2025-04-15 ENCOUNTER — Encounter: Admitting: Family Medicine
